# Patient Record
Sex: Female | Born: 1975 | Race: Black or African American | Hispanic: No | Marital: Single | Smoking: Never smoker
Health system: Southern US, Community
[De-identification: ages and names within clinical notes are randomized; demographics above are authoritative.]

## PROBLEM LIST (undated history)

## (undated) DIAGNOSIS — D649 Anemia, unspecified: Secondary | ICD-10-CM

## (undated) DIAGNOSIS — M797 Fibromyalgia: Secondary | ICD-10-CM

## (undated) DIAGNOSIS — E538 Deficiency of other specified B group vitamins: Secondary | ICD-10-CM

## (undated) HISTORY — DX: Fibromyalgia: M79.7

## (undated) HISTORY — DX: Anemia, unspecified: D64.9

---

## 1998-12-26 ENCOUNTER — Emergency Department (HOSPITAL_COMMUNITY): Admission: EM | Admit: 1998-12-26 | Discharge: 1998-12-26 | Payer: Self-pay | Admitting: *Deleted

## 1999-04-03 ENCOUNTER — Emergency Department (HOSPITAL_COMMUNITY): Admission: EM | Admit: 1999-04-03 | Discharge: 1999-04-03 | Payer: Self-pay | Admitting: Emergency Medicine

## 2000-10-20 ENCOUNTER — Other Ambulatory Visit: Admission: RE | Admit: 2000-10-20 | Discharge: 2000-10-20 | Payer: Self-pay | Admitting: Family Medicine

## 2000-11-17 ENCOUNTER — Other Ambulatory Visit: Admission: RE | Admit: 2000-11-17 | Discharge: 2000-11-17 | Payer: Self-pay | Admitting: Gynecology

## 2000-11-17 ENCOUNTER — Encounter (INDEPENDENT_AMBULATORY_CARE_PROVIDER_SITE_OTHER): Payer: Self-pay

## 2002-02-15 ENCOUNTER — Other Ambulatory Visit: Admission: RE | Admit: 2002-02-15 | Discharge: 2002-02-15 | Payer: Self-pay | Admitting: Gynecology

## 2003-07-31 ENCOUNTER — Emergency Department (HOSPITAL_COMMUNITY): Admission: EM | Admit: 2003-07-31 | Discharge: 2003-08-01 | Payer: Self-pay | Admitting: Emergency Medicine

## 2005-05-23 ENCOUNTER — Ambulatory Visit: Payer: Self-pay

## 2005-05-23 ENCOUNTER — Encounter: Payer: Self-pay | Admitting: Internal Medicine

## 2005-08-01 ENCOUNTER — Emergency Department (HOSPITAL_COMMUNITY): Admission: EM | Admit: 2005-08-01 | Discharge: 2005-08-01 | Payer: Self-pay | Admitting: Emergency Medicine

## 2005-08-07 ENCOUNTER — Encounter: Admission: RE | Admit: 2005-08-07 | Discharge: 2005-08-07 | Payer: Self-pay | Admitting: Otolaryngology

## 2007-08-20 ENCOUNTER — Encounter: Admission: RE | Admit: 2007-08-20 | Discharge: 2007-08-20 | Payer: Self-pay | Admitting: Internal Medicine

## 2008-07-02 ENCOUNTER — Encounter: Admission: RE | Admit: 2008-07-02 | Discharge: 2008-07-02 | Payer: Self-pay | Admitting: Obstetrics and Gynecology

## 2009-02-24 ENCOUNTER — Emergency Department (HOSPITAL_COMMUNITY): Admission: EM | Admit: 2009-02-24 | Discharge: 2009-02-24 | Payer: Self-pay | Admitting: Emergency Medicine

## 2009-02-25 ENCOUNTER — Encounter (INDEPENDENT_AMBULATORY_CARE_PROVIDER_SITE_OTHER): Payer: Self-pay | Admitting: Emergency Medicine

## 2009-02-25 ENCOUNTER — Ambulatory Visit: Payer: Self-pay | Admitting: Vascular Surgery

## 2009-02-25 ENCOUNTER — Ambulatory Visit (HOSPITAL_COMMUNITY): Admission: RE | Admit: 2009-02-25 | Discharge: 2009-02-25 | Payer: Self-pay | Admitting: Emergency Medicine

## 2010-01-15 ENCOUNTER — Encounter: Admission: RE | Admit: 2010-01-15 | Discharge: 2010-01-15 | Payer: Self-pay | Admitting: Internal Medicine

## 2010-08-14 IMAGING — US US EXTREM LOW VENOUS BILAT
1 series · 14 of 24 positions shown · non-contrast
Comparison: None.

CLINICAL DATA: Bilateral calf pain, right greater than left,
symptoms over the past week with progressive worsening.

BILATERAL LOWER EXTREMITY VENOUS DOPPLER ULTRASOUND 01/15/2010:
TECHNIQUE: Gray-scale sonography with compression, as well as
color and duplex Doppler ultrasound, were performed to evaluate the
deep venous system from the level of the common femoral vein
through the popliteal and proximal calf veins. Evaluation also
included physiologic response to augmentation.

[Series 1: us extrem low venous bilat · 14 of 48 slices shown]
[im 1/48]
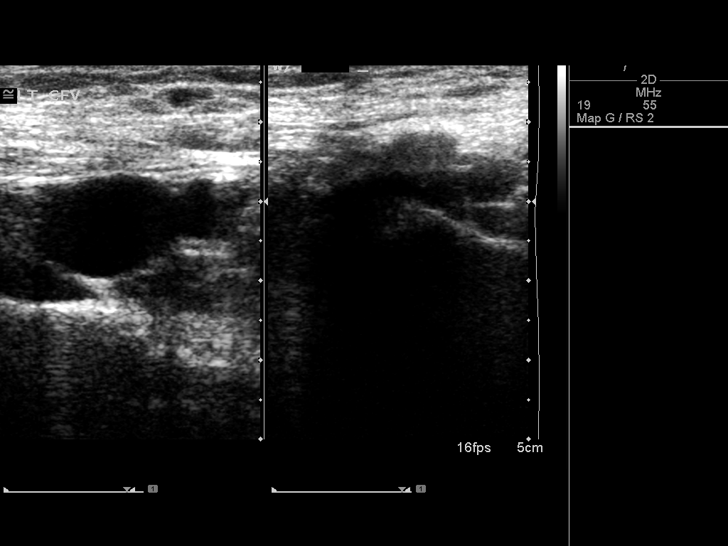
[im 5/48]
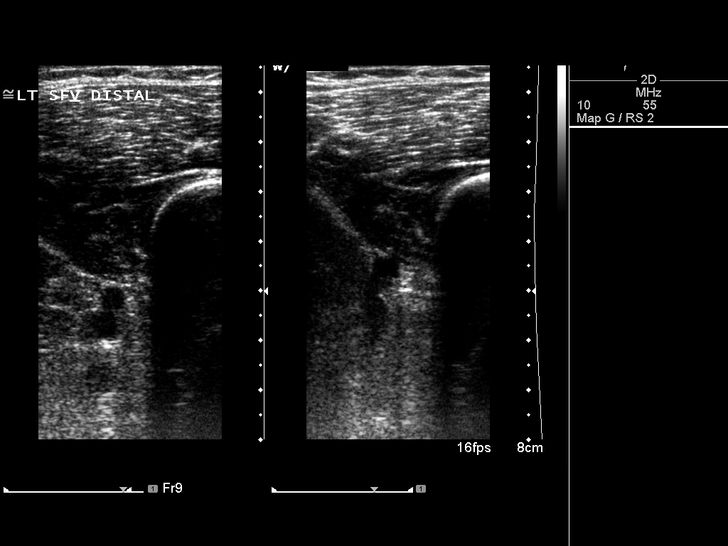
[im 9/48]
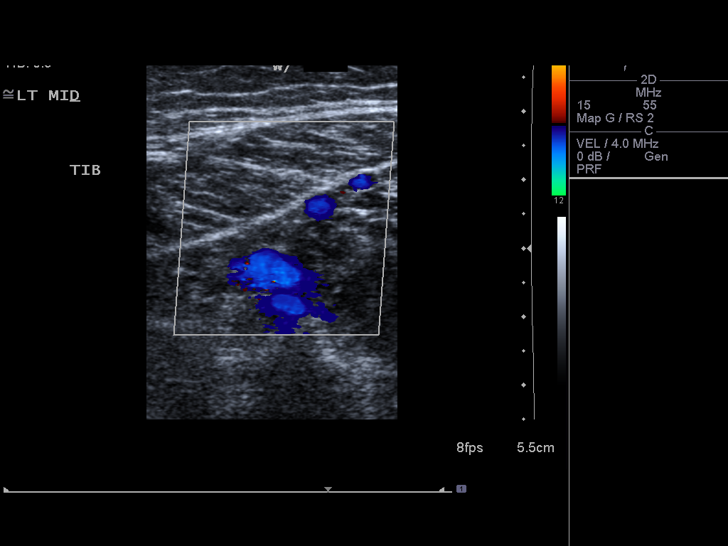
[im 13/48]
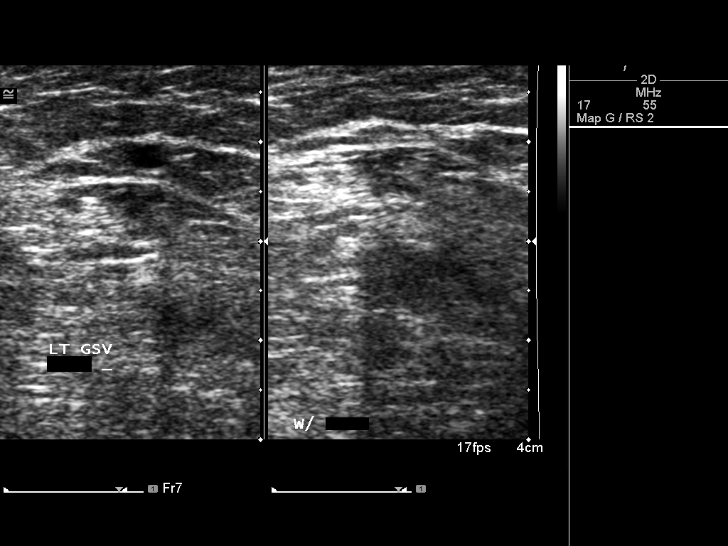
[im 15/48]
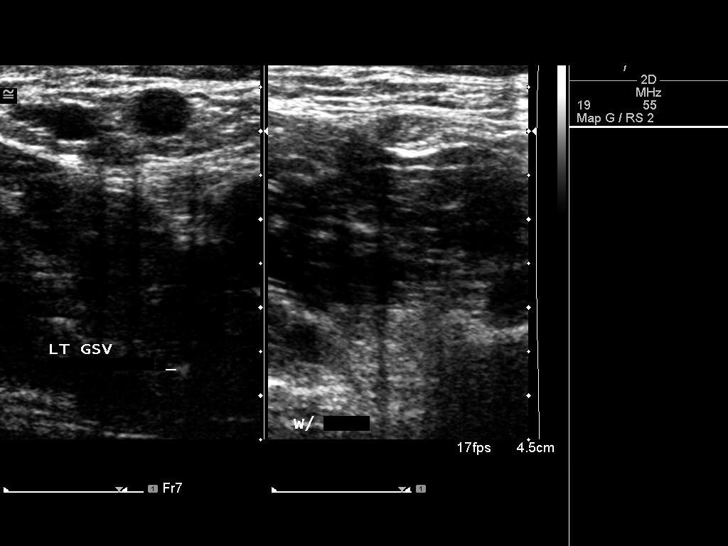
[im 19/48]
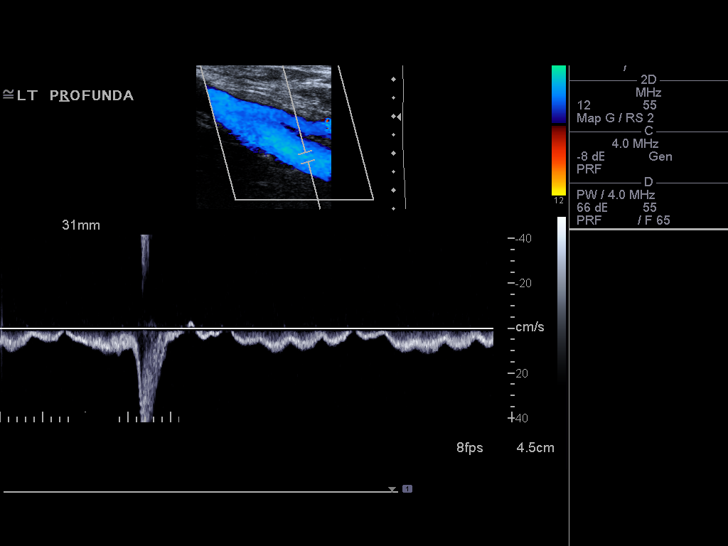
[im 23/48]
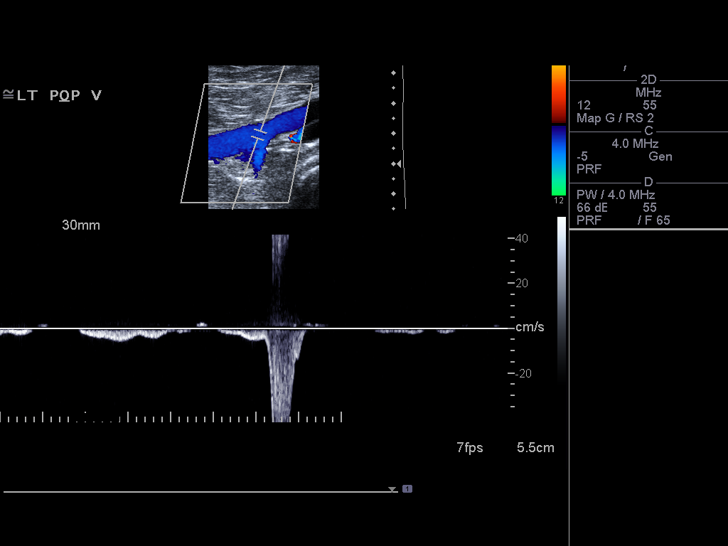
[im 25/48]
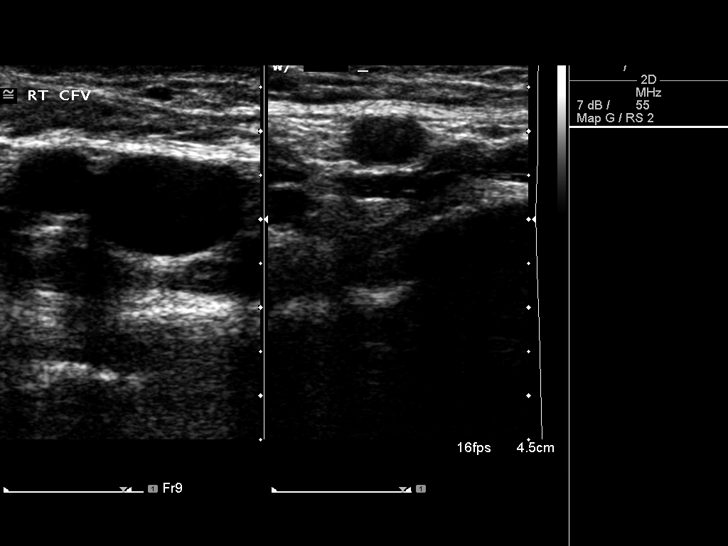
[im 29/48]
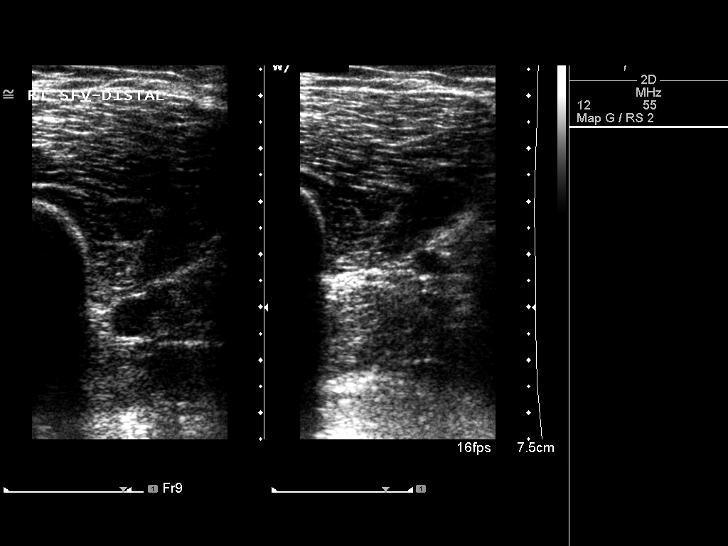
[im 33/48]
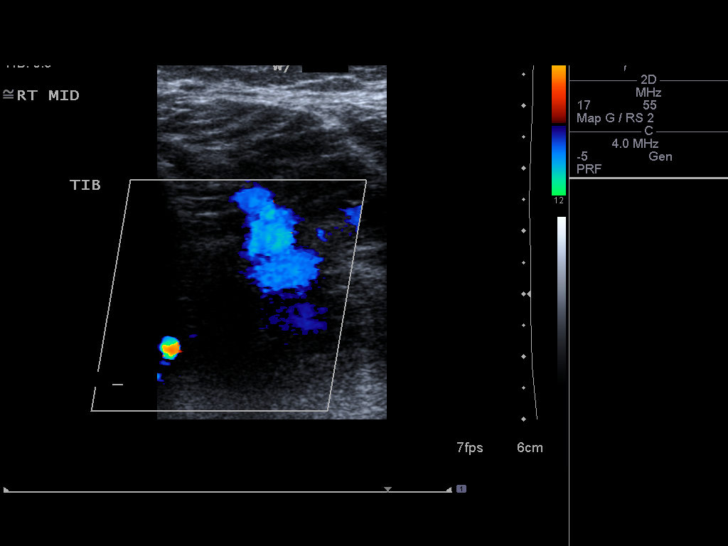
[im 37/48]
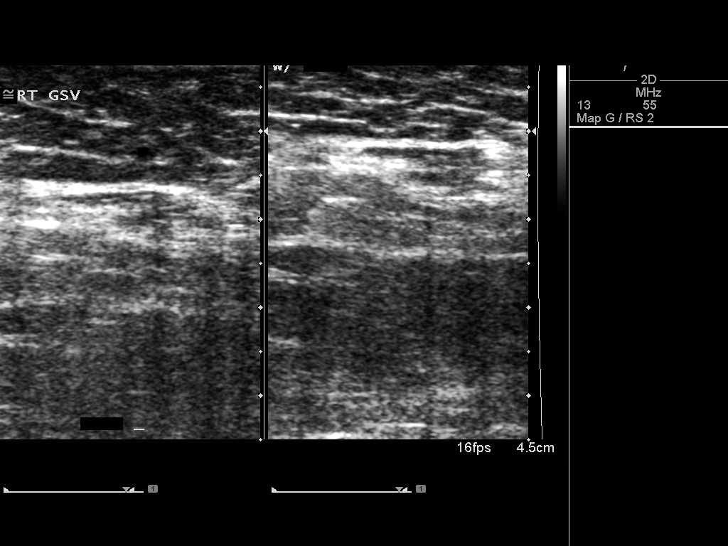
[im 39/48]
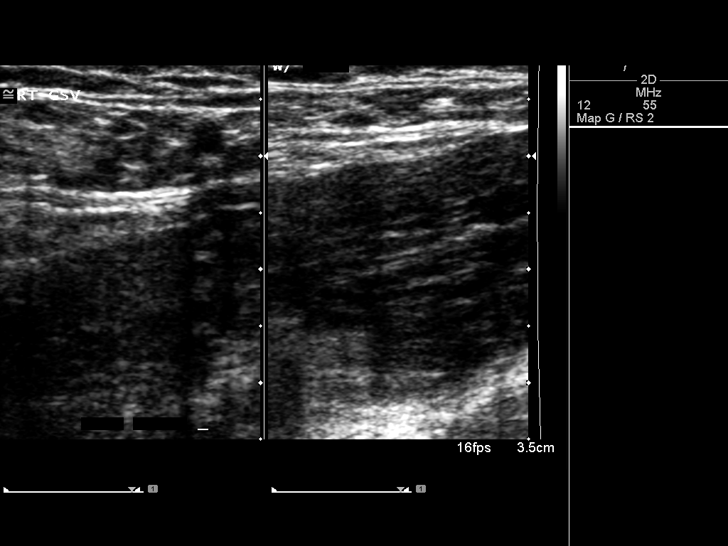
[im 43/48]
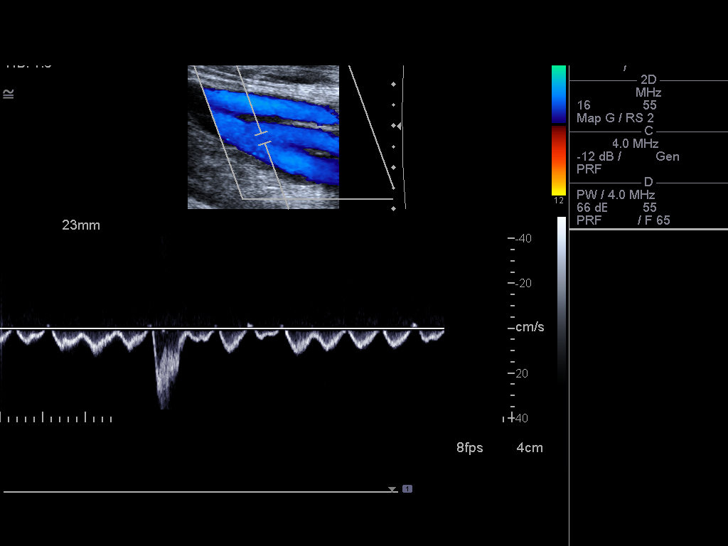
[im 48/48]
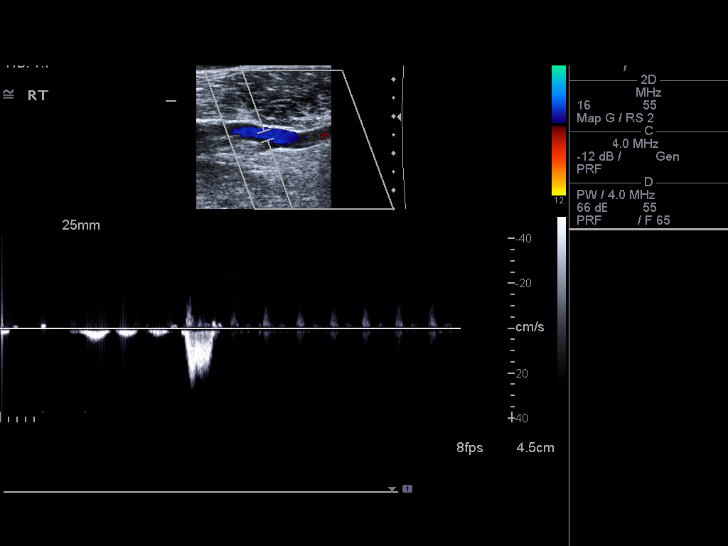

[14 of 24 positions shown; findings below may reference images not displayed]

FINDINGS: All of the visualized deep veins in both lower
extremities demonstrate normal Doppler signal, normal
compressibility, normal phasicity, and normal physiologic response
to augmentation.  No gray scale filling defects were identified.
The visualized greater saphenous veins are similarly patent in both
lower extremities.
IMPRESSION: No evidence of DVT or superficial thrombophlebitis involving either
the right or left lower extremity.

## 2010-08-23 ENCOUNTER — Emergency Department (HOSPITAL_COMMUNITY): Payer: BC Managed Care – PPO

## 2010-08-23 ENCOUNTER — Observation Stay (HOSPITAL_COMMUNITY)
Admission: EM | Admit: 2010-08-23 | Discharge: 2010-08-25 | Disposition: A | Discharge status: Home / Self Care | Payer: BC Managed Care – PPO | Attending: Infectious Diseases | Admitting: Infectious Diseases

## 2010-08-23 DIAGNOSIS — I891 Lymphangitis: Secondary | ICD-10-CM | POA: Insufficient documentation

## 2010-08-23 DIAGNOSIS — F3289 Other specified depressive episodes: Secondary | ICD-10-CM | POA: Insufficient documentation

## 2010-08-23 DIAGNOSIS — L02519 Cutaneous abscess of unspecified hand: Principal | ICD-10-CM | POA: Insufficient documentation

## 2010-08-23 DIAGNOSIS — R209 Unspecified disturbances of skin sensation: Secondary | ICD-10-CM

## 2010-08-23 DIAGNOSIS — F329 Major depressive disorder, single episode, unspecified: Secondary | ICD-10-CM | POA: Insufficient documentation

## 2010-08-23 DIAGNOSIS — L03019 Cellulitis of unspecified finger: Principal | ICD-10-CM | POA: Insufficient documentation

## 2010-08-23 LAB — LACTIC ACID, PLASMA: Lactic Acid, Venous: 1.4 mmol/L (ref 0.5–2.2)

## 2010-08-23 LAB — COMPREHENSIVE METABOLIC PANEL
AST: 17 U/L (ref 0–37)
Albumin: 4 g/dL (ref 3.5–5.2)
Alkaline Phosphatase: 50 U/L (ref 39–117)
GFR calc Af Amer: >60 mL/min (ref >60)
GFR calc non Af Amer: >60 mL/min (ref >60)
Glucose, Bld: 89 mg/dL (ref 70–99)
Sodium: 140 mEq/L (ref 135–145)
Total Bilirubin: 0.7 mg/dL (ref 0.3–1.2)
Total Protein: 7.3 g/dL (ref 6.0–8.3)

## 2010-08-23 LAB — URINE MICROSCOPIC-ADD ON

## 2010-08-23 LAB — RAPID URINE DRUG SCREEN, HOSP PERFORMED: Tetrahydrocannabinol: NOT DETECTED

## 2010-08-23 LAB — URINALYSIS, ROUTINE W REFLEX MICROSCOPIC
Ketones, ur: 15 mg/dL — AB
Leukocytes, UA: NEGATIVE
Specific Gravity, Urine: 1.036 — ABNORMAL HIGH (ref 1.005–1.030)
Urine Glucose, Fasting: NEGATIVE mg/dL

## 2010-08-23 LAB — CBC
MCH: 28.2 pg (ref 26.0–34.0)
RBC: 5.1 MIL/uL (ref 3.87–5.11)
RDW: 13.7 % (ref 11.5–15.5)
WBC: 4.1 10*3/uL (ref 4.0–10.5)

## 2010-08-23 LAB — POCT PREGNANCY, URINE: Preg Test, Ur: NEGATIVE

## 2010-08-23 LAB — DIFFERENTIAL
Basophils Relative: 1 % (ref 0–1)
Eosinophils Absolute: 0.2 10*3/uL (ref 0.0–0.7)
Eosinophils Relative: 6 % — ABNORMAL HIGH (ref 0–5)
Lymphocytes Relative: 23 % (ref 12–46)
Neutrophils Relative %: 61 % (ref 43–77)

## 2010-08-24 LAB — URINE CULTURE
Colony Count: NO GROWTH
Culture: NO GROWTH

## 2010-08-24 LAB — BASIC METABOLIC PANEL
BUN: 8 mg/dL (ref 6–23)
Calcium: 8.6 mg/dL (ref 8.4–10.5)
Creatinine, Ser: 0.77 mg/dL (ref 0.4–1.2)
Glucose, Bld: 90 mg/dL (ref 70–99)

## 2010-08-24 LAB — CBC
HCT: 37.3 % (ref 36.0–46.0)
MCH: 27.2 pg (ref 26.0–34.0)
MCV: 81.1 fL (ref 78.0–100.0)
Platelets: 170 10*3/uL (ref 150–400)
RBC: 4.6 MIL/uL (ref 3.87–5.11)

## 2010-08-25 DIAGNOSIS — M79609 Pain in unspecified limb: Secondary | ICD-10-CM

## 2010-08-29 LAB — CULTURE, BLOOD (ROUTINE X 2)

## 2010-08-30 LAB — CULTURE, BLOOD (ROUTINE X 2)
Culture  Setup Time: 201202140116
Culture: NO GROWTH

## 2010-09-15 ENCOUNTER — Emergency Department (HOSPITAL_BASED_OUTPATIENT_CLINIC_OR_DEPARTMENT_OTHER)
Admission: EM | Admit: 2010-09-15 | Discharge: 2010-09-15 | Disposition: A | Discharge status: Home / Self Care | Payer: BC Managed Care – PPO | Attending: Emergency Medicine | Admitting: Emergency Medicine

## 2010-09-15 ENCOUNTER — Emergency Department (INDEPENDENT_AMBULATORY_CARE_PROVIDER_SITE_OTHER): Payer: BC Managed Care – PPO

## 2010-09-15 DIAGNOSIS — R079 Chest pain, unspecified: Secondary | ICD-10-CM

## 2010-09-15 LAB — CBC
MCH: 27.9 pg (ref 26.0–34.0)
MCV: 78 fL (ref 78.0–100.0)
Platelets: 145 10*3/uL — ABNORMAL LOW (ref 150–400)
RDW: 14.2 % (ref 11.5–15.5)
WBC: 4.1 10*3/uL (ref 4.0–10.5)

## 2010-09-15 LAB — DIFFERENTIAL
Basophils Absolute: 0 10*3/uL (ref 0.0–0.1)
Basophils Relative: 1 % (ref 0–1)
Eosinophils Absolute: 0.2 10*3/uL (ref 0.0–0.7)
Lymphocytes Relative: 30 % (ref 12–46)
Monocytes Absolute: 0.4 10*3/uL (ref 0.1–1.0)
Neutro Abs: 2.3 10*3/uL (ref 1.7–7.7)
Neutrophils Relative %: 56 % (ref 43–77)

## 2010-09-15 LAB — BASIC METABOLIC PANEL
BUN: 12 mg/dL (ref 6–23)
Calcium: 8.8 mg/dL (ref 8.4–10.5)
Chloride: 105 mEq/L (ref 96–112)
Glucose, Bld: 85 mg/dL (ref 70–99)
Potassium: 3.7 mEq/L (ref 3.5–5.1)

## 2010-09-15 LAB — POCT CARDIAC MARKERS: Troponin i, poc: <0.05 ng/mL (ref 0.00–0.09)

## 2010-10-12 NOTE — Discharge Summary (Signed)
Samantha Velazquez, Samantha Velazquez             ACCOUNT NO.:  [id number]  MEDICAL RECORD NO.:  [id number]           PATIENT TYPE:  I  LOCATION:  5006                         FACILITY:  [facility]  PHYSICIAN:  Lacretia Leigh. Estefany Goebel, M.D.DATE OF BIRTH:  1975/08/18  DATE OF ADMISSION:  08/23/2010 DATE OF DISCHARGE:  08/25/2010                              DISCHARGE SUMMARY   DISCHARGE DIAGNOSES: 1. Cellulitis. 2. Depression.  DISCHARGE MEDICATIONS: 1. Augmentin 875 mg p.o. b.i.d. for 12 days. 2. Ultram 50 mg q.6 h. as needed for pain for 10 days.  DISPOSITION AND FOLLOWUP:  [name] was discharged on August 25, 2010, in stable and improved condition.  Upon followup on August 26, 2010, with Dr. Dorothyann Peng, she will need the following things checked. 1. Continued recession of finger erythema and swelling secondary to     her cellulitis.  She will be on Augmentin, and it will be important     to assess her further clinical improvement. 2. For her depression, to continue to assess her for response to     antidepressant therapy and assess the need for medication change.     This followup will occur on August 26, 2010, with Dr. Dorothyann Peng.  PROCEDURES PERFORMED: 1. On August 23, 2010, chest x-ray, 2-view, showed no acute     cardiopulmonary disease. 2. On August 23, 2010, a hand x-ray of her left hand showed no acute     findings specifically no bony erosions to suggest osteomyelitis. 3. On August 25, 2010, a lower extremity Doppler, please follow up     what is in dictation.  CONSULTATIONS:  None.  ADMITTING HISTORY AND PHYSICAL:  [name] is a 35 year old woman with a past medical history of depression presenting with redness, pain, and decreased range of motion of her left index finger.  The pain has been associated with itchiness.  The pain is constant and not exacerbated by movement.  Of note, the patient has 2 cats at home.  She does not recall a bite or scratch.   She does not have fevers or chills associated with these symptoms.  She went to Urgent Care yesterday and was told that she was having an allergic reaction and to take Claritin.  She did not improve and thus came into the ED.  In ED, she received a hand x-ray showing no acute findings or bony erosions.  Had a normal chest x-ray. For cellulitis, she was admitted.  PHYSICAL EXAMINATION:  VITAL SIGNS:  Temperature 99.1, blood pressure 138/77, pulse is 112, respiratory rate 18-20, O2 sats are 100% on room air. GENERAL:  In no acute distress. EYES:  Pupils are equal, round, and reactive to light.  Extraocular movements intact.  Anicteric. ENT:  Moist mucous membranes. NECK:  Supple without thyromegaly. RESPIRATIONS:  Clear to auscultation bilaterally without wheezes. CARDIOVASCULAR:  S1 and S2.  Regular rate and rhythm without murmurs, rubs, or gallops. GI:  Positive bowel sounds.  Soft, nontender, and nondistended without organomegaly. EXTREMITIES:  Cool.  Without edema.  Red streaks from index finger to wrist.  Decreased range of motion of her  left index finger. SKIN:  Without rashes or areas of skin breakdown. LYMPHATICS:  Without axial lymphadenopathy or generalized lymphadenopathy. MUSCULOSKELETAL:  Without joint pain apart from left finger.  Reports soreness in calves. NEUROLOGIC:  Cranial nerves II-XII grossly intact.  Flat affect.  ADMISSION LABORATORIES:  Sodium 140, potassium 3.7, chloride 106, bicarb 26, BUN 10, creatinine 0.78, glucose 85.  White blood cells 4.1, hemoglobin 14.4, platelets 183, absolute neutrophil count 2.5, mean corpuscular volume 81.2.  Bilirubin of 0.7, alk phos of 50, AST of 17, ALT of 11, protein 7.3, albumin 4.0, calcium 9.0.  Urinalysis showed few epithelial cells, 0-2 white blood cells, 3-6 red blood cells, rare bacteria with a specific gravity of 1.036, small hemoglobin, small bilirubin, 15 ketones, negative nitrites, negative leukocyte  esterase.  HOSPITAL COURSE BY PROBLEM: 1. Cellulitis.  On admission, her cellulitis showed red streaks up to     the wrist.  She was started on Unasyn IV for this and treated with     pain medications.  Over the course of 2 days, the redness decreased     and the range of motion of her left index finger improved with a     hand x-ray showing no acute findings.  Unasyn was     chosen for empiric coverage due to exposure to cats.  At     the time of discharge, she is afebrile without leukocytosis.  She     will be discharged with a 12-day course for a total of 14 days with     Augmentin 875 mg b.i.d. p.o. 2. Depression.  Of note on this admission, Ms. Favela states that she     has been out of work for a few days secondary to anxiety and     depression with a doctor's note.  This should be followed up as an     outpatient.  As the primary care physician will best know how to     proceed with this problem.  DISCHARGE LABORATORIES AND VITAL SIGNS:  Vitals:  Temperature 98.0, pulse 80, respirations 18, systolic blood pressure 108, diastolic blood pressure 70, O2 sats 100% on room air.  Discharge labs:  Blood culture x2 drawn on August 23, 2010, show no growth to date.  Urine culture, no growth.  Sodium 139, potassium 3.6, chloride 108, bicarb 27, BUN 8, creatinine 0.77, glucose 90.  White blood cells 4.8, hemoglobin 12.5, hematocrit 37.3, platelets 170, MCV 81.1.    ______________________________ Carrolyn Meiers, MD   ______________________________ Lacretia Leigh. Ninetta Lights, M.D.   MH/MEDQ  D:  08/25/2010  T:  08/26/2010  Job:  [id number]  cc:   Candyce Churn. Allyne Gee, M.D.  Electronically Signed by Carrolyn Meiers MD on 09/16/2010 03:06:19 PM Electronically Signed by Johny Sax M.D. on 10/12/2010 04:09:38 PM

## 2010-10-16 LAB — URINALYSIS, ROUTINE W REFLEX MICROSCOPIC
Ketones, ur: NEGATIVE mg/dL
Leukocytes, UA: NEGATIVE
Nitrite: NEGATIVE
Protein, ur: 30 mg/dL — AB
Urobilinogen, UA: 1 mg/dL (ref 0.0–1.0)

## 2010-10-16 LAB — COMPREHENSIVE METABOLIC PANEL
ALT: 15 U/L (ref 0–35)
AST: 17 U/L (ref 0–37)
Albumin: 4.3 g/dL (ref 3.5–5.2)
Alkaline Phosphatase: 41 U/L (ref 39–117)
BUN: 13 mg/dL (ref 6–23)
GFR calc Af Amer: >60 mL/min (ref >60)
Potassium: 4.1 mEq/L (ref 3.5–5.1)
Sodium: 137 mEq/L (ref 135–145)
Total Protein: 8 g/dL (ref 6.0–8.3)

## 2010-10-16 LAB — PROTIME-INR: INR: 1.1 (ref 0.00–1.49)

## 2010-10-16 LAB — PREGNANCY, URINE: Preg Test, Ur: NEGATIVE

## 2010-10-16 LAB — URINE MICROSCOPIC-ADD ON

## 2010-12-28 ENCOUNTER — Emergency Department (HOSPITAL_BASED_OUTPATIENT_CLINIC_OR_DEPARTMENT_OTHER)
Admission: EM | Admit: 2010-12-28 | Discharge: 2010-12-28 | Disposition: A | Discharge status: Home / Self Care | Payer: BC Managed Care – PPO | Attending: Emergency Medicine | Admitting: Emergency Medicine

## 2010-12-28 DIAGNOSIS — H9209 Otalgia, unspecified ear: Secondary | ICD-10-CM | POA: Insufficient documentation

## 2010-12-28 DIAGNOSIS — H7290 Unspecified perforation of tympanic membrane, unspecified ear: Secondary | ICD-10-CM | POA: Insufficient documentation

## 2011-04-14 IMAGING — CR DG CHEST 2V
2 series · 2 of 2 positions shown · non-contrast
Comparison: 08/23/2010

CLINICAL DATA: Chest pain

CHEST - 2 VIEW

[w chest pa]
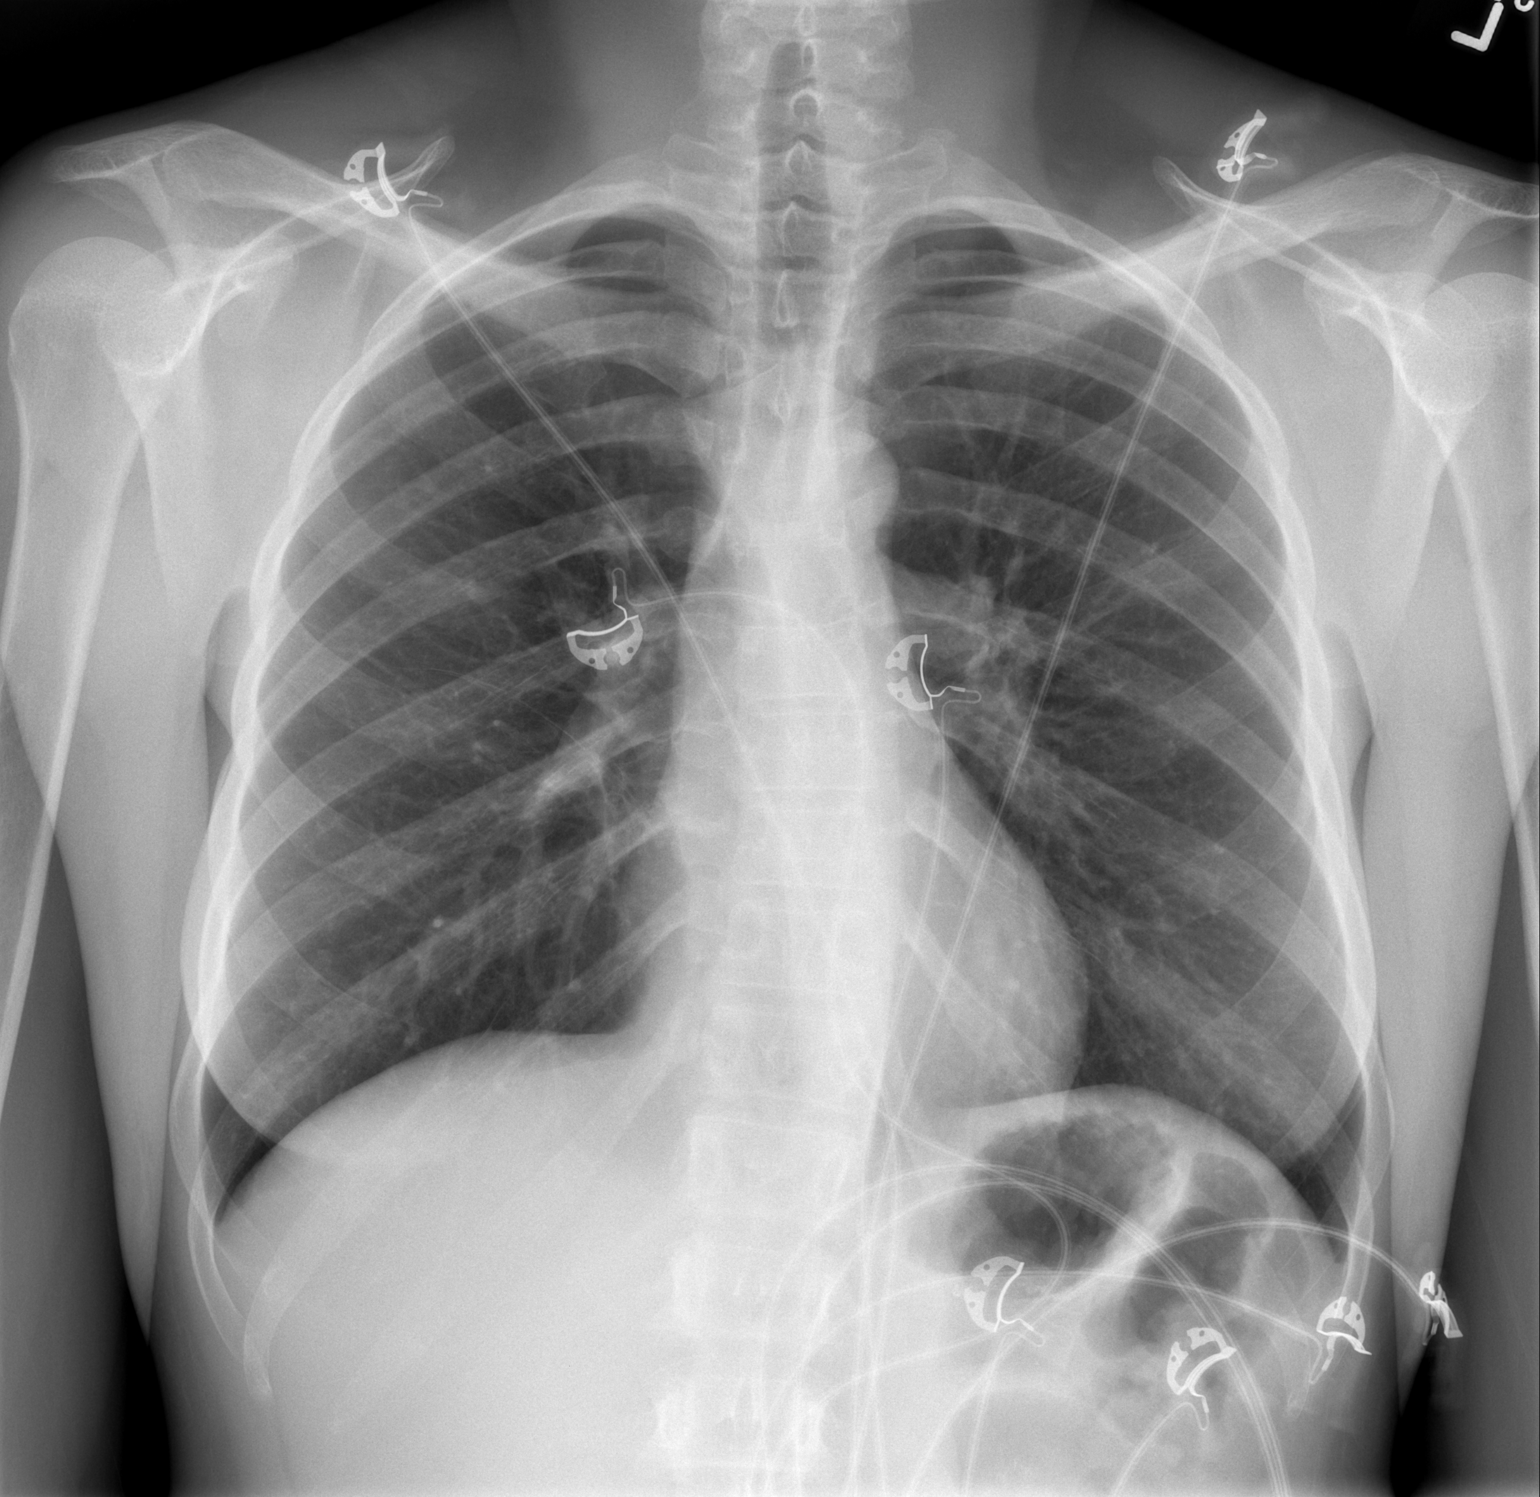

[w chest lat]
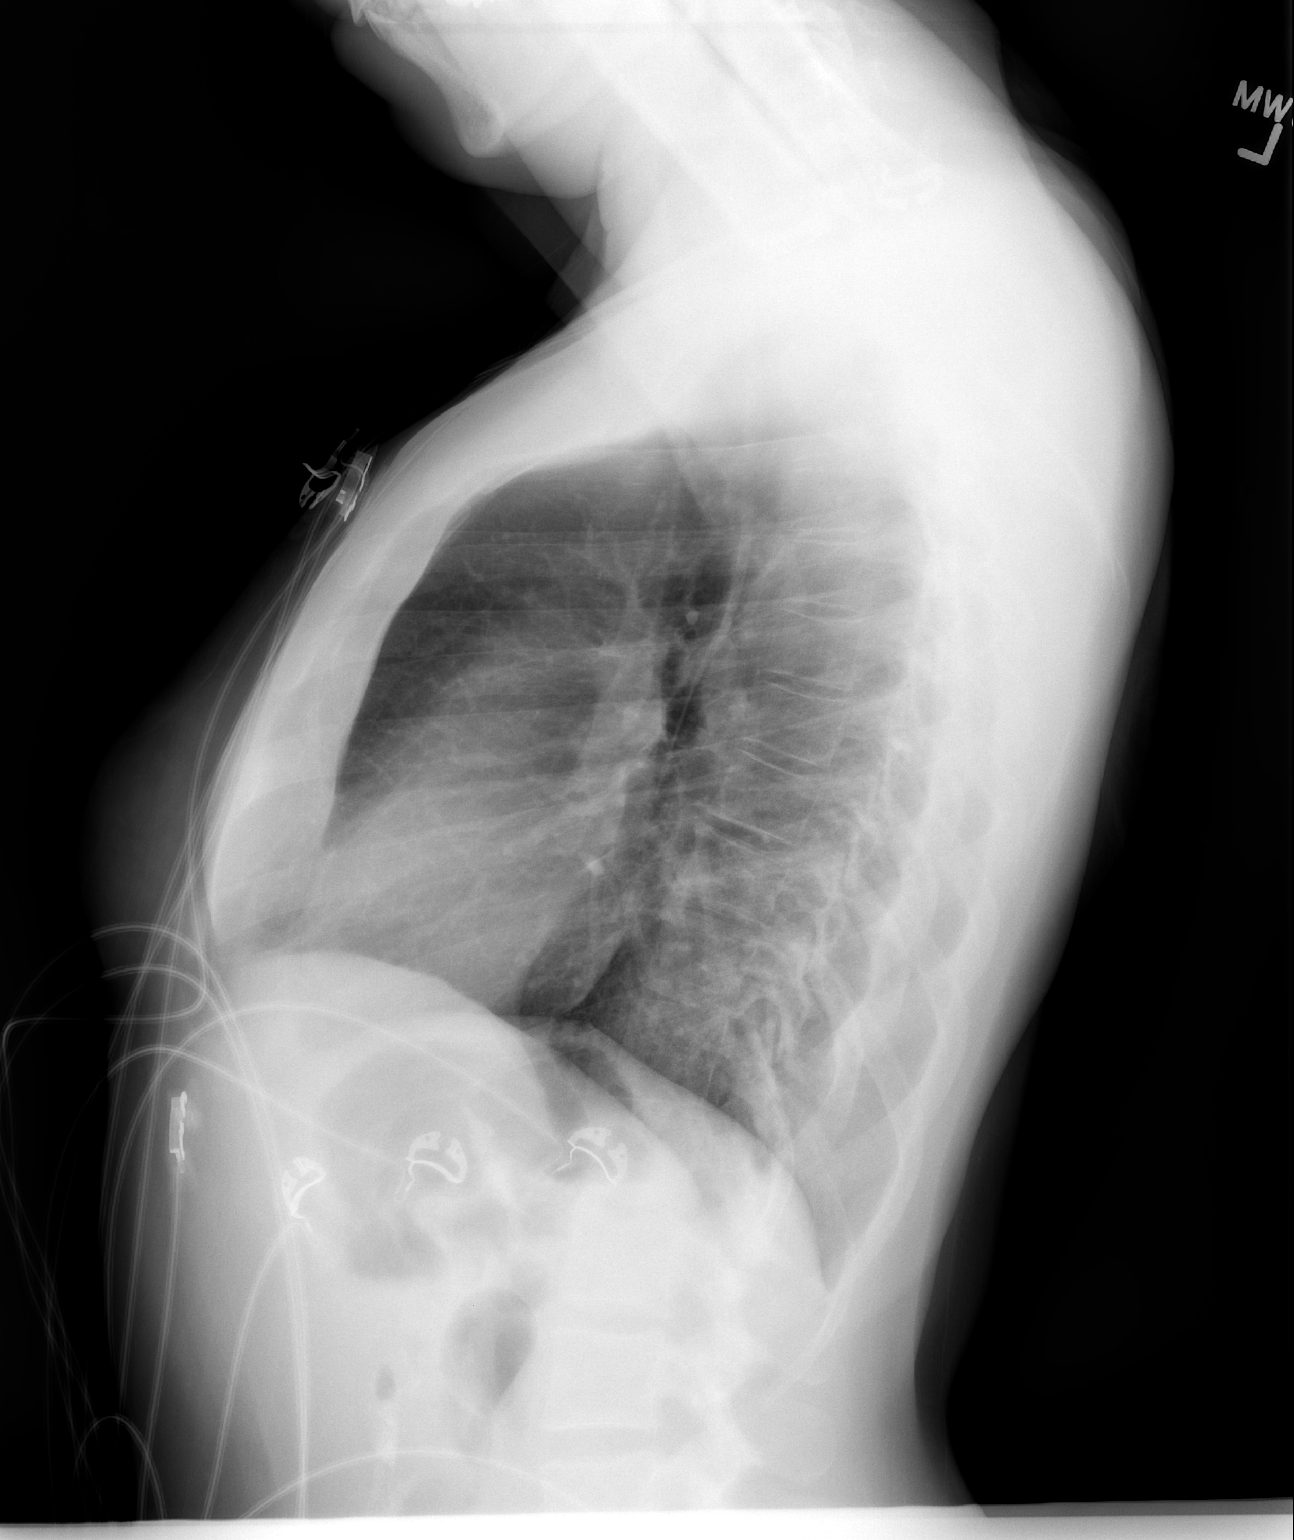

[2 of 2 positions shown; findings below may reference images not displayed]

FINDINGS: Heart and mediastinal contours normal.  Lungs clear.  No
pleural fluid.  Osseous structures and soft tissues unremarkable.

Again noted is mild biconcave thoracolumbar scoliosis.
IMPRESSION: No active disease.

## 2011-05-23 ENCOUNTER — Emergency Department (HOSPITAL_COMMUNITY)
Admission: EM | Admit: 2011-05-23 | Discharge: 2011-05-23 | Disposition: A | Discharge status: Home / Self Care | Payer: BC Managed Care – PPO | Attending: Emergency Medicine | Admitting: Emergency Medicine

## 2011-05-23 DIAGNOSIS — L293 Anogenital pruritus, unspecified: Secondary | ICD-10-CM | POA: Insufficient documentation

## 2011-05-23 DIAGNOSIS — M25569 Pain in unspecified knee: Secondary | ICD-10-CM | POA: Insufficient documentation

## 2011-05-23 DIAGNOSIS — M2550 Pain in unspecified joint: Secondary | ICD-10-CM

## 2011-05-23 DIAGNOSIS — M79609 Pain in unspecified limb: Secondary | ICD-10-CM | POA: Insufficient documentation

## 2011-05-23 DIAGNOSIS — L299 Pruritus, unspecified: Secondary | ICD-10-CM

## 2011-05-23 DIAGNOSIS — L298 Other pruritus: Secondary | ICD-10-CM | POA: Insufficient documentation

## 2011-05-23 DIAGNOSIS — R21 Rash and other nonspecific skin eruption: Secondary | ICD-10-CM | POA: Insufficient documentation

## 2011-05-23 DIAGNOSIS — L2989 Other pruritus: Secondary | ICD-10-CM | POA: Insufficient documentation

## 2011-05-23 HISTORY — DX: Deficiency of other specified B group vitamins: E53.8

## 2011-05-23 MED ORDER — HYDROXYZINE HCL 25 MG PO TABS: 25.0000 mg | ORAL_TABLET | Freq: Four times a day (QID) | ORAL | Status: AC

## 2011-05-23 NOTE — ED Provider Notes (Signed)
History     CSN: [id number] Arrival date & time: 05/23/2011  8:25 AM   First MD Initiated Contact with Patient 05/23/11 0845      Chief Complaint  Patient presents with  . Pruritis    (Consider location/radiation/quality/duration/timing/severity/associated sxs/prior treatment) HPI Comments: Patient has several weeks to months of intermittent complaints of redness in both hands and upper arms.  It is associated with pruritus.  She describes that there is intermittent rash although there is no rash at this time.  She also has very strong pains particularly in her hands and arms in her knees.  Her knees are not causing her pain today.  She's been using ibuprofen to help with her pain which helps to some degree.  She's also been using psoriasin  cream on her hands.  She denies any fevers, nausea, vomiting, facial rash.  Patient is a 35 y.o. female presenting with vaginal itching. The history is provided by the patient. No language interpreter was used.  Vaginal Itching This is a recurrent problem. The current episode started more than 1 week ago. The problem occurs daily. The problem has not changed since onset.Pertinent negatives include no chest pain, no abdominal pain, no headaches and no shortness of breath. The symptoms are aggravated by nothing. The symptoms are relieved by medications.    Past Medical History  Diagnosis Date  . Vitamin B 12 deficiency   . Vitamin D deficiency     History reviewed. No pertinent past surgical history.  Family History  Problem Relation Age of Onset  . Hypertension Mother   . Cancer Father   . Diabetes Other   . Hypertension Other     History  Substance Use Topics  . Smoking status: Never Smoker   . Smokeless tobacco: Not on file  . Alcohol Use: Yes    OB History    Grav Para Term Preterm Abortions TAB SAB Ect Mult Living                  Review of Systems  Constitutional: Negative.  Negative for fever and chills.  HENT: Negative.    Eyes: Negative.  Negative for discharge and redness.  Respiratory: Negative.  Negative for cough and shortness of breath.   Cardiovascular: Negative.  Negative for chest pain.  Gastrointestinal: Negative.  Negative for nausea, vomiting, abdominal pain and diarrhea.  Genitourinary: Negative.  Negative for dysuria and vaginal discharge.  Musculoskeletal: Positive for arthralgias. Negative for back pain.  Skin: Positive for color change and rash.  Neurological: Negative.  Negative for syncope and headaches.  Hematological: Negative.  Negative for adenopathy.  Psychiatric/Behavioral: Negative.  Negative for confusion.  All other systems reviewed and are negative.    Allergies  Vicodin  Home Medications   Current Outpatient Rx  Name Route Sig Dispense Refill  . AMOXICILLIN-POT CLAVULANATE 875-125 MG PO TABS Oral Take 1 tablet by mouth every 12 (twelve) hours.      Marland Kitchen VITAMIN D 1000 UNITS PO TABS Oral Take 2,000 Units by mouth daily.      Marland Kitchen PSORIASIN EX Apply externally Apply 1 application topically 2 (two) times daily as needed. For psoriasis     . PREDNISONE 20 MG PO TABS Oral Take 20 mg by mouth daily. Take 20mg  daily for 5 days       BP 131/85  Pulse 108  Temp(Src) 97.9 F (36.6 C) (Oral)  Resp 16  SpO2 100%  LMP 05/09/2011  Physical Exam  Constitutional: She is  oriented to person, place, and time. She appears well-developed and well-nourished.  HENT:  Head: Normocephalic and atraumatic.  Eyes: Conjunctivae and EOM are normal. Pupils are equal, round, and reactive to light.  Neck: Normal range of motion. Neck supple.  Pulmonary/Chest: Effort normal.  Musculoskeletal: Normal range of motion.  Neurological: She is alert and oriented to person, place, and time.  Skin: Skin is warm and dry. No rash noted. No erythema. No pallor.  Psychiatric: She has a normal mood and affect. Her behavior is normal. Judgment and thought content normal.    ED Course  Procedures (including  critical care time)  Labs Reviewed - No data to display No results found.   No diagnosis found.    MDM  Patient presents with complaints of intermittent itching primarily in her hands and upper arms.  She has this intermittent redness and joint pains as well.  Her Dr. has obtained followup with the rheumatologist for her on November 28.  Patient is no acute signs of infection.  No acute signs of septic arthritis.  No acute signs of hives for allergic reaction at this time.  I've advised the patient to use over-the-counter Benadryl I will prescribe her Atarax for her itching.  I've also reassured her that the rheumatologist is the appropriate followup for her combined issues.        Nat Christen, MD 05/23/11 [phone]

## 2011-05-23 NOTE — ED Notes (Signed)
Patient presents with mild redness and itching to bilateral hands x several weeks with joint pain.  Patient seen by PCP and has an appointment with a rheumatologist on 06-08-11.

## 2012-02-20 ENCOUNTER — Encounter: Payer: Self-pay | Admitting: Obstetrics and Gynecology

## 2012-02-20 ENCOUNTER — Ambulatory Visit (INDEPENDENT_AMBULATORY_CARE_PROVIDER_SITE_OTHER): Payer: BC Managed Care – PPO | Admitting: Obstetrics and Gynecology

## 2012-02-20 VITALS — BP 110/70 | Ht 63.0 in | Wt 151.0 lb

## 2012-02-20 DIAGNOSIS — N898 Other specified noninflammatory disorders of vagina: Secondary | ICD-10-CM

## 2012-02-20 DIAGNOSIS — R102 Pelvic and perineal pain: Secondary | ICD-10-CM

## 2012-02-20 DIAGNOSIS — N949 Unspecified condition associated with female genital organs and menstrual cycle: Secondary | ICD-10-CM

## 2012-02-20 LAB — POCT WET PREP (WET MOUNT)

## 2012-02-20 NOTE — Progress Notes (Signed)
ANNUAL GYNECOLOGIC EXAMINATION   Samantha Velazquez is a 36 y.o. female, G0P0, who presents for an annual exam. See above. The patient has not been seen in office for almost 3 years.  She has a history of pelvic pain.  She has recently been treated with antibiotics.  She complains of vaginal burning and discharge.  She has been treated with Diflucan.  She is currently taking metronidazole.  She complains of vague pelvic pain that has been worse over the last 2 months.  Her pain occurs twice each month.  It is related 2 out of 10. She has some stomach disturbance.  Prior Hysterectomy: No    History   Social History  . Marital Status: Single    Spouse Name: N/A    Number of Children: N/A  . Years of Education: N/A   Social History Main Topics  . Smoking status: Never Smoker   . Smokeless tobacco: None  . Alcohol Use: Yes  . Drug Use: No  . Sexually Active: Yes    Birth Control/ Protection: None   Other Topics Concern  . None   Social History Narrative  . None    Menstrual cycle:   LMP: Patient's last menstrual period was 02/03/2012.             The following portions of the patient's history were reviewed and updated as appropriate: allergies, current medications, past family history, past medical history, past social history, past surgical history and problem list.  Review of Systems Pertinent items are noted in HPI. Breast:Negative for breast lump,nipple discharge or nipple retraction Gastrointestinal: Negative for abdominal pain, change in bowel habits or rectal bleeding Urinary:negative   Objective:    BP 110/70  Ht 5\' 3"  (1.6 m)  Wt 151 lb (68.493 kg)  BMI 26.75 kg/m2  LMP 02/03/2012    Weight:  Wt Readings from Last 1 Encounters:  02/20/12 151 lb (68.493 kg)          BMI: Body mass index is 26.75 kg/(m^2).  General Appearance: Alert, appropriate appearance for age. No acute distress HEENT: Grossly normal Neck / Thyroid: Supple, no masses, nodes or  enlargement Lungs: clear to auscultation bilaterally Back: No CVA tenderness Breast Exam: No masses or nodes.No dimpling, nipple retraction or discharge. Cardiovascular: Regular rate and rhythm. S1, S2, no murmur Gastrointestinal: Soft, non-tender, no masses or organomegaly  ++++++++++++++++++++++++++++++++++++++++++++++++++++++++  Pelvic Exam: External genitalia: normal general appearance Vaginal: normal without tenderness, induration or masses and relaxation: No Cervix: normal appearance Adnexa: normal bimanual exam Uterus: normal size, shape, and consistency Rectovaginal: normal rectal, no masses  ++++++++++++++++++++++++++++++++++++++++++++++++++++++++  Lymphatic Exam: Non-palpable nodes in neck, clavicular, axillary, or inguinal regions Neurologic: Normal speech, no tremor  Psychiatric: Alert and oriented, appropriate affect.   Wet Prep:   PH 4.5, no yeast seen, whiff negative Urinalysis:  not applicable UPT:           Not done   Assessment:    Normal gyn exam   Overweight or obese: No   Pelvic relaxation: No  Vaginal burning and discomfort.  Pelvic pain   Plan:   Return for ultrasound Contraception:vasectomy    Medications prescribe:  Mycolog II cream  STD screen request: Yes ; gonorrhea and Chlamydia  The updated Pap smear screening guidelines were discussed with the patient. The patient requested that I obtain a Pap smear: No.  Kegel exercises discussed: Yes.  Proper diet and regular exercise were reviewed.  Annual mammograms recommended starting at age 65. Proper  breast care was discussed.  Screening colonoscopy is recommended beginning at age 3.  Regular health maintenance was reviewed.  Sleep hygiene was discussed.  Adequate calcium and vitamin D intake was emphasized.  Leonard Schwartz, M.D. 02/20/2012

## 2012-02-21 ENCOUNTER — Encounter: Payer: Self-pay | Admitting: Obstetrics and Gynecology

## 2012-02-21 ENCOUNTER — Other Ambulatory Visit: Payer: Self-pay | Admitting: Emergency Medicine

## 2012-02-21 ENCOUNTER — Ambulatory Visit (INDEPENDENT_AMBULATORY_CARE_PROVIDER_SITE_OTHER): Payer: BC Managed Care – PPO

## 2012-02-21 ENCOUNTER — Ambulatory Visit (INDEPENDENT_AMBULATORY_CARE_PROVIDER_SITE_OTHER): Payer: BC Managed Care – PPO | Admitting: Obstetrics and Gynecology

## 2012-02-21 VITALS — BP 112/72 | Wt 151.0 lb

## 2012-02-21 DIAGNOSIS — N949 Unspecified condition associated with female genital organs and menstrual cycle: Secondary | ICD-10-CM

## 2012-02-21 DIAGNOSIS — R109 Unspecified abdominal pain: Secondary | ICD-10-CM

## 2012-02-21 DIAGNOSIS — R102 Pelvic and perineal pain: Secondary | ICD-10-CM

## 2012-02-21 LAB — GC/CHLAMYDIA PROBE AMP, GENITAL: Chlamydia, DNA Probe: NEGATIVE

## 2012-02-21 MED ORDER — NYSTATIN-TRIAMCINOLONE 100000-0.1 UNIT/GM-% EX CREA: TOPICAL_CREAM | Freq: Four times a day (QID) | CUTANEOUS | Status: DC

## 2012-02-21 NOTE — Patient Instructions (Signed)
Diagnostic Laparoscopy Laparoscopy is a surgical procedure. It is used to diagnose and treat diseases inside the belly(abdomen). It is usually a brief, common, and relatively simple procedure. The laparoscopeis a thin, lighted, pencil-sized instrument. It is like a telescope. It is inserted into your abdomen through a small cut (incision). Your caregiver can look at the organs inside your body through this instrument. He or she can see if there is anything abnormal. Laparoscopy can be done either in a hospital or outpatient clinic. You may be given a mild sedative to help you relax before the procedure. Once in the operating room, you will be given a drug to make you sleep (general anesthesia). Laparoscopy usually lasts less than 1 hour. After the procedure, you will be monitored in a recovery area until you are stable and doing well. Once you are home, it will take 2 to 3 days to fully recover. RISKS AND COMPLICATIONS  Laparoscopy has relatively few risks. Your caregiver will discuss the risks with you before the procedure. Some problems that can occur include:  Infection.   Bleeding.   Damage to other organs.   Anesthetic side effects.  PROCEDURE Once you receive anesthesia, your surgeon inflates the abdomen with a harmless gas (carbon dioxide). This makes the organs easier to see. The laparoscope is inserted into the abdomen through a small incision. This allows your surgeon to see into the abdomen. Other small instruments are also inserted into the abdomen through other small openings. Many surgeons attach a video camera to the laparoscope to enlarge the view. During a diagnostic laparoscopy, the surgeon may be looking for inflammation, infection, or cancer. Your surgeon may take tissue samples(biopsies). The samples are sent to a specialist in looking at cells and tissue samples (pathologist). The pathologist examines them under a microscope. Biopsies can help to diagnose or confirm a  disease. AFTER THE PROCEDURE   The gas is released from inside the abdomen.   The incisions are closed with stitches (sutures). Because these incisions are small (usually less than 1/2 inch), there is usually minimal discomfort after the procedure. There may be some mild discomfort in the throat. This is from the tube placed in the throat while you were sleeping. You may have some mild abdominal discomfort. There may also be discomfort from the instrument placement incisions in the abdomen.   The recovery time is shortened as long as there are no complications.   You will rest in a recovery room until stable and doing well. As long as there are no complications, you may be allowed to go home.  FINDING OUT THE RESULTS OF YOUR TEST Not all test results are available during your visit. If your test results are not back during the visit, make an appointment with your caregiver to find out the results. Do not assume everything is normal if you have not heard from your caregiver or the medical facility. It is important for you to follow up on all of your test results. HOME CARE INSTRUCTIONS   Take all medicines as directed.   Only take over-the-counter or prescription medicines for pain, discomfort, or fever as directed by your caregiver.   Resume daily activities as directed.   Showers are preferred over baths.   You may resume sexual activities in 1 week or as directed.   Do not drive while taking narcotics.  SEEK MEDICAL CARE IF:   There is increasing abdominal pain.   There is new pain in the shoulders (shoulder strap areas).     You feel lightheaded or faint.   You have the chills.   You or your child has an oral temperature above 102 F (38.9 C).   There is pus-like (purulent) drainage from any of the wounds.   You are unable to pass gas or have a bowel movement.   You feel sick to your stomach (nauseous) or throw up (vomit).  MAKE SURE YOU:   Understand these instructions.    Will watch your condition.   Will get help right away if you are not doing well or get worse.  Document Released: 10/03/2000 Document Revised: 06/16/2011 Document Reviewed: 06/27/2007 [employer] Patient Information 2012 [employer]. 

## 2012-02-21 NOTE — Addendum Note (Signed)
Addended by: Janine Limbo on: 02/21/2012 08:30 AM   Modules accepted: Orders

## 2012-02-21 NOTE — Progress Notes (Signed)
HISTORY OF PRESENT ILLNESS  [name] is a 36 y.o. year old female,G0P0, who presents for a problem visit. Gonorrhea and Chlamydia were negative from yesterday.  Subjective:  Continues to complain of vague mid abdominal pain.  Objective:  BP 112/72  Wt 151 lb (68.493 kg)  LMP 02/03/2012   General: no distress GI: soft and nontender, no masses  Exam deferred.  Ultrasound: Normal uterus and ovaries.  No masses appreciated.  Thickened endometrium.  Assessment:  Abdominal pain of uncertain etiology.  Normal ultrasound. Probable constipation.  Plan:  Management options reviewed.  The patient elects to observe her only for now.  Information given about laparoscopy.  Return to office in 1 year(s).   Leonard Schwartz M.D.  02/21/2012 2:14 PM

## 2012-02-22 ENCOUNTER — Other Ambulatory Visit: Payer: Self-pay | Admitting: Obstetrics and Gynecology

## 2012-02-22 DIAGNOSIS — R102 Pelvic and perineal pain: Secondary | ICD-10-CM

## 2012-08-20 ENCOUNTER — Encounter: Payer: Self-pay | Admitting: Obstetrics and Gynecology

## 2012-08-20 ENCOUNTER — Ambulatory Visit: Payer: BC Managed Care – PPO | Admitting: Obstetrics and Gynecology

## 2012-08-20 ENCOUNTER — Telehealth: Payer: Self-pay | Admitting: Obstetrics and Gynecology

## 2012-08-20 VITALS — BP 110/70 | Temp 98.8°F | Wt 145.0 lb

## 2012-08-20 DIAGNOSIS — N6320 Unspecified lump in the left breast, unspecified quadrant: Secondary | ICD-10-CM

## 2012-08-20 NOTE — Progress Notes (Signed)
37 YO complains of left breast lump with a family history of 2 second degree relatives with breast cancer (5th & 6 decades).   Felt the painful lump on yesterday that is also sore to touch. Denies nipple discharge, trauma or change in skin of breast.   O: Breasts: right with fibrocystic changes and no dominant nodules, tenderness, nipple discharge,  skin changes or adenopathy; left with area of thickening in the 12 o'clock-2 o'clock position  with tenderness but no adenopathy,skin changes or nipple discharge  A: Left Breast Mass     Second Degree Relatives Breast Cancer  P: Dx MG with possible left breast ultrasound at the breast center     Mentioned option for general surgery referral-will await assessment from MG to decide     RTO-as scheduled or prn  Yona Kosek, PA-C

## 2012-08-22 ENCOUNTER — Ambulatory Visit
Admission: RE | Admit: 2012-08-22 | Discharge: 2012-08-22 | Disposition: A | Discharge status: Home / Self Care | Payer: BC Managed Care – PPO | Source: Ambulatory Visit | Attending: Obstetrics and Gynecology | Admitting: Obstetrics and Gynecology

## 2012-08-22 DIAGNOSIS — N6320 Unspecified lump in the left breast, unspecified quadrant: Secondary | ICD-10-CM

## 2012-08-30 ENCOUNTER — Other Ambulatory Visit: Payer: Self-pay

## 2012-10-04 ENCOUNTER — Other Ambulatory Visit: Payer: Self-pay | Admitting: Obstetrics and Gynecology

## 2012-10-05 ENCOUNTER — Other Ambulatory Visit: Payer: Self-pay | Admitting: Family Medicine

## 2012-10-05 DIAGNOSIS — N63 Unspecified lump in unspecified breast: Secondary | ICD-10-CM

## 2012-10-10 ENCOUNTER — Ambulatory Visit
Admission: RE | Admit: 2012-10-10 | Discharge: 2012-10-10 | Disposition: A | Discharge status: Home / Self Care | Payer: BC Managed Care – PPO | Source: Ambulatory Visit | Attending: Family Medicine | Admitting: Family Medicine

## 2012-10-10 DIAGNOSIS — N63 Unspecified lump in unspecified breast: Secondary | ICD-10-CM

## 2012-11-01 ENCOUNTER — Telehealth: Payer: Self-pay | Admitting: Oncology

## 2012-11-01 NOTE — Telephone Encounter (Signed)
S/W PT IN RE NP APPT 05/22 @ 10:30 W/DR. SHADAD REFERRING DR. SHAILI DEVESHWAR DX- LOW WBC WELCOME PACKET MAILED.

## 2012-11-06 ENCOUNTER — Telehealth: Payer: Self-pay | Admitting: Oncology

## 2012-11-06 NOTE — Telephone Encounter (Signed)
C/D 11/06/12 for appt. 11/29/12

## 2012-11-23 ENCOUNTER — Other Ambulatory Visit: Payer: Self-pay | Admitting: Oncology

## 2012-11-23 DIAGNOSIS — D649 Anemia, unspecified: Secondary | ICD-10-CM

## 2012-11-29 ENCOUNTER — Other Ambulatory Visit (HOSPITAL_BASED_OUTPATIENT_CLINIC_OR_DEPARTMENT_OTHER): Payer: BC Managed Care – PPO | Admitting: Lab

## 2012-11-29 ENCOUNTER — Telehealth: Payer: Self-pay | Admitting: Oncology

## 2012-11-29 ENCOUNTER — Ambulatory Visit: Payer: BC Managed Care – PPO

## 2012-11-29 ENCOUNTER — Encounter: Payer: Self-pay | Admitting: Oncology

## 2012-11-29 ENCOUNTER — Ambulatory Visit (HOSPITAL_BASED_OUTPATIENT_CLINIC_OR_DEPARTMENT_OTHER): Payer: BC Managed Care – PPO | Admitting: Oncology

## 2012-11-29 VITALS — BP 121/76 | HR 90 | Temp 97.1°F | Wt 142.4 lb

## 2012-11-29 DIAGNOSIS — D649 Anemia, unspecified: Secondary | ICD-10-CM

## 2012-11-29 DIAGNOSIS — D72819 Decreased white blood cell count, unspecified: Secondary | ICD-10-CM

## 2012-11-29 DIAGNOSIS — D509 Iron deficiency anemia, unspecified: Secondary | ICD-10-CM

## 2012-11-29 LAB — CBC WITH DIFFERENTIAL/PLATELET
Basophils Absolute: 0.1 10*3/uL (ref 0.0–0.1)
Eosinophils Absolute: 0.1 10*3/uL (ref 0.0–0.5)
HGB: 11.6 g/dL (ref 11.6–15.9)
MCV: 77 fL — ABNORMAL LOW (ref 79.5–101.0)
MONO%: 10.3 % (ref 0.0–14.0)
NEUT#: 1.8 10*3/uL (ref 1.5–6.5)
RDW: 25 % — ABNORMAL HIGH (ref 11.2–14.5)
lymph#: 1.3 10*3/uL (ref 0.9–3.3)

## 2012-11-29 LAB — FERRITIN: Ferritin: 32 ng/mL (ref 10–291)

## 2012-11-29 LAB — IRON AND TIBC
Iron: 63 ug/dL (ref 42–145)
UIBC: 237 ug/dL (ref 125–400)

## 2012-11-29 LAB — COMPREHENSIVE METABOLIC PANEL (CC13)
Albumin: 3.5 g/dL (ref 3.5–5.0)
BUN: 18.6 mg/dL (ref 7.0–26.0)
Calcium: 9.1 mg/dL (ref 8.4–10.4)
Chloride: 107 mEq/L (ref 98–107)
Glucose: 92 mg/dl (ref 70–99)
Potassium: 4.5 mEq/L (ref 3.5–5.1)

## 2012-11-29 NOTE — Progress Notes (Signed)
Reason for Referral: Leukopenia and anemia.   HPI: Samantha Velazquez is a pleasant 37 year old woman currently of [place] whom I am evaluating for leukocytopenia and anemia. She is a pleasant and healthy woman with a past medical history significant for fibromyalgia, vitamin D deficiency and B12 deficiency. She follows up with her primary care physician as well as a rheumatology. On 10/09/2012 she had a CBC done and showed that she has anemia and a hemoglobin of 9.0 and an MCV of 68.8. She was also noted there to have  leukopenia with a white cell count of 2.9 but a normal differential. She was started on supplements including iron supplements, zinc supplements as well as vitamin D. She have that helped to some fatigue and some tiredness at times but have corrected and slightly improved since starting her vitamin supplements.  She have not any problem with leukopenia in the past however she have had iron deficiency in the past and did require IV iron. She does reports menorrhagia times however most recently her menstrual cycles have been rather regular and not heavy.  She is not reporting any bleeding problems including epistaxis, hematochezia, melena, genitourinary bleed or easy bruisability. She had not had any opportunistic infections she does report arthralgias and myalgias presumably related to her fibromyalgia.  Overall she continues to perform activities of daily living without any hindrance or decline. She continued to drive, work full time and to run a personal business.   Past Medical History  Diagnosis Date  . Vitamin B 12 deficiency   . Vitamin D deficiency   . Fibromyalgia   :  Current Outpatient Prescriptions  Medication Sig Dispense Refill  . Ascorbic Acid (VITAMIN C) 1000 MG tablet Take 1,000 mg by mouth daily.      . cholecalciferol (VITAMIN D) 1000 UNITS tablet Take 50,000 Units by mouth 2 (two) times a week. Tuesday and friday      . Cyanocobalamin (VITAMIN B-12 IJ) Inject as  directed.      . Iron-FA-B Cmp-C-Biot-Probiotic (FUSION PLUS PO) Take 1 tablet by mouth daily.      . metroNIDAZOLE (FLAGYL) 500 MG tablet Take 500 mg by mouth 3 (three) times daily. As needed      . phentermine 37.5 MG capsule Take 37.5 mg by mouth every morning.      . Pseudoephedrine-Guaifenesin (AMITEX PSE PO) Take 1 tablet by mouth daily.      Marland Kitchen zinc gluconate 50 MG tablet Take 50 mg by mouth daily.       No current facility-administered medications for this visit.     Allergies  Allergen Reactions  . Vicodin (Hydrocodone-Acetaminophen) Anxiety and Palpitations  :  Family History  Problem Relation Age of Onset  . Hypertension Mother   . Cancer Father   . Diabetes Other   . Hypertension Other   :  History   Social History  . Marital Status: Single    Spouse Name: N/A    Number of Children: N/A  . Years of Education: N/A   Occupational History  . Not on file.   Social History Main Topics  . Smoking status: Never Smoker   . Smokeless tobacco: Never Used  . Alcohol Use: Yes  . Drug Use: No  . Sexually Active: Yes    Birth Control/ Protection: Abstinence, None   Other Topics Concern  . Not on file   Social History Narrative  . No narrative on file  :  A comprehensive review of systems was negative.  Exam: ECOG 0 Blood pressure 121/76, pulse 90, temperature 97.1 F (36.2 C), temperature source Oral, weight 142 lb 7 oz (64.609 kg). General appearance: alert Head: Normocephalic, without obvious abnormality, atraumatic Nose: Nares normal. Septum midline. Mucosa normal. No drainage or sinus tenderness. Throat: lips, mucosa, and tongue normal; teeth and gums normal Neck: no adenopathy, no carotid bruit, no JVD, supple, symmetrical, trachea midline and thyroid not enlarged, symmetric, no tenderness/mass/nodules Resp: clear to auscultation bilaterally Chest wall: no tenderness Cardio: regular rate and rhythm, S1, S2 normal, no murmur, click, rub or gallop GI:  soft, non-tender; bowel sounds normal; no masses,  no organomegaly Extremities: extremities normal, atraumatic, no cyanosis or edema Skin: Skin color, texture, turgor normal. No rashes or lesions Lymph nodes: Cervical, supraclavicular, and axillary nodes normal.   Recent Labs  11/29/12 1052  WBC 3.6*  HGB 11.6  HCT 36.6  PLT 238    Recent Labs  11/29/12 1052  NA 141  K 4.5  CL 107  CO2 27  GLUCOSE 92  BUN 18.6  CREATININE 0.8  CALCIUM 9.1     Blood smear review: I. personally reviewed her peripheral smear today and showed no abnormal white cells. There were no blasts or any dysplasia. The red cells appeared microcytic and hypochromic without any red cell fragmentation. Platelet counts appear to be slightly increased in the size is slightly enlarged.  Assessment and Plan:   37 year old woman with the following issues:  1. Leukopenia: The differential diagnosis discussed today with Ms. Ueda. It appears that her white cell count has improved today with a total white cell count of 3.6 with the lower limit of normal 3.9. Possibilities include reactive leukocytopenia versus ethnic variation versus leukopenia due to iron deficiency. Usually he will not see iron levels impact total white cell count but definitely correlated in her case. Her white cell count improved with the correction of her iron. Myelodysplastic syndrome, myeloproliferative disorders or leukemia are extremely a likely at this point. Her peripheral smear including the differential was perfectly normal and does not suggest a serious bone disorder. I do not recommend further workup or evaluation at this time however I do recommend continuous followup. For that reason, I will have her come back in 4 months time and repeat a CBC and a peripheral smear with a personal review to make sure that continues to be the case.   2. Iron deficiency anemia: She is currently on oral supplements in her hemoglobin have corrected at  this time. Her MCV is still low but is improving dramatically with oral iron. I discussed with her IV iron supplementation if needed to the form of Feraheme. She have received IV iron in the past and for the time being per first continue with the oral supplements and I certainly agree with that. I will repeat her iron stores in 4 months to insure that she is not developing any further iron deficiency anemia.  All her questions were answered today.

## 2012-11-29 NOTE — Progress Notes (Signed)
Checked in new patient with no financial issues.  °

## 2012-12-21 ENCOUNTER — Telehealth: Payer: Self-pay | Admitting: *Deleted

## 2012-12-21 NOTE — Telephone Encounter (Signed)
sw pt gv appt d/t for 04/02/13 for labs@1pm  and ov@1 :30pm.the patient is aware that i will also mail a letter/avs as well...td

## 2013-03-29 ENCOUNTER — Telehealth: Payer: Self-pay | Admitting: *Deleted

## 2013-03-29 NOTE — Telephone Encounter (Signed)
Lm informed the pt that her appts for 04/02/13 was rs to 04/01/13. gv appts for 04/01/13 w/labs@ 9:45am and ov@ 10:15am....td

## 2013-04-01 ENCOUNTER — Ambulatory Visit: Payer: BC Managed Care – PPO | Admitting: Nurse Practitioner

## 2013-04-01 ENCOUNTER — Other Ambulatory Visit: Payer: BC Managed Care – PPO

## 2013-04-02 ENCOUNTER — Other Ambulatory Visit: Payer: BC Managed Care – PPO | Admitting: Lab

## 2013-04-02 ENCOUNTER — Ambulatory Visit: Payer: BC Managed Care – PPO | Admitting: Oncology

## 2013-04-02 ENCOUNTER — Telehealth: Payer: Self-pay | Admitting: Oncology

## 2013-04-02 NOTE — Telephone Encounter (Signed)
Gave pt appt for Novemner with labs , r/s from September per pt rqst

## 2013-05-16 ENCOUNTER — Other Ambulatory Visit: Payer: Self-pay

## 2013-05-22 ENCOUNTER — Ambulatory Visit: Payer: BC Managed Care – PPO | Admitting: Oncology

## 2013-05-22 ENCOUNTER — Other Ambulatory Visit: Payer: BC Managed Care – PPO | Admitting: Lab

## 2013-07-15 ENCOUNTER — Encounter (HOSPITAL_COMMUNITY): Payer: Self-pay | Admitting: Emergency Medicine

## 2013-07-15 ENCOUNTER — Emergency Department (HOSPITAL_COMMUNITY): Payer: Self-pay

## 2013-07-15 DIAGNOSIS — J3489 Other specified disorders of nose and nasal sinuses: Secondary | ICD-10-CM | POA: Insufficient documentation

## 2013-07-15 DIAGNOSIS — Z79899 Other long term (current) drug therapy: Secondary | ICD-10-CM | POA: Insufficient documentation

## 2013-07-15 DIAGNOSIS — IMO0001 Reserved for inherently not codable concepts without codable children: Secondary | ICD-10-CM | POA: Insufficient documentation

## 2013-07-15 DIAGNOSIS — R05 Cough: Secondary | ICD-10-CM | POA: Insufficient documentation

## 2013-07-15 DIAGNOSIS — B9689 Other specified bacterial agents as the cause of diseases classified elsewhere: Secondary | ICD-10-CM | POA: Insufficient documentation

## 2013-07-15 DIAGNOSIS — R0602 Shortness of breath: Secondary | ICD-10-CM | POA: Insufficient documentation

## 2013-07-15 DIAGNOSIS — E559 Vitamin D deficiency, unspecified: Secondary | ICD-10-CM | POA: Insufficient documentation

## 2013-07-15 DIAGNOSIS — E538 Deficiency of other specified B group vitamins: Secondary | ICD-10-CM | POA: Insufficient documentation

## 2013-07-15 DIAGNOSIS — N760 Acute vaginitis: Secondary | ICD-10-CM | POA: Insufficient documentation

## 2013-07-15 DIAGNOSIS — Z3202 Encounter for pregnancy test, result negative: Secondary | ICD-10-CM | POA: Insufficient documentation

## 2013-07-15 DIAGNOSIS — R059 Cough, unspecified: Secondary | ICD-10-CM | POA: Insufficient documentation

## 2013-07-15 DIAGNOSIS — Z885 Allergy status to narcotic agent status: Secondary | ICD-10-CM | POA: Insufficient documentation

## 2013-07-15 DIAGNOSIS — R071 Chest pain on breathing: Secondary | ICD-10-CM | POA: Insufficient documentation

## 2013-07-15 DIAGNOSIS — A499 Bacterial infection, unspecified: Secondary | ICD-10-CM | POA: Insufficient documentation

## 2013-07-15 LAB — BASIC METABOLIC PANEL
BUN: 20 mg/dL (ref 6–23)
CHLORIDE: 102 meq/L (ref 96–112)
CO2: 25 mEq/L (ref 19–32)
Calcium: 9.1 mg/dL (ref 8.4–10.5)
Creatinine, Ser: 0.7 mg/dL (ref 0.50–1.10)
GFR calc Af Amer: >90 mL/min (ref >90)
GLUCOSE: 87 mg/dL (ref 70–99)
POTASSIUM: 4.3 meq/L (ref 3.7–5.3)
SODIUM: 138 meq/L (ref 137–147)

## 2013-07-15 LAB — CBC
HEMATOCRIT: 37.2 % (ref 36.0–46.0)
Hemoglobin: 12.8 g/dL (ref 12.0–15.0)
MCH: 27.5 pg (ref 26.0–34.0)
MCHC: 34.4 g/dL (ref 30.0–36.0)
MCV: 80 fL (ref 78.0–100.0)
Platelets: 191 10*3/uL (ref 150–400)
RBC: 4.65 MIL/uL (ref 3.87–5.11)
RDW: 16.9 % — ABNORMAL HIGH (ref 11.5–15.5)
WBC: 6.7 10*3/uL (ref 4.0–10.5)

## 2013-07-15 LAB — POCT I-STAT TROPONIN I: TROPONIN I, POC: 0 ng/mL (ref 0.00–0.08)

## 2013-07-15 LAB — POCT PREGNANCY, URINE: Preg Test, Ur: NEGATIVE

## 2013-07-15 NOTE — ED Notes (Signed)
Pt reports chest tightness/heaviness that began on Saturday w/ some shortness of breath and nausea - pain is constant, worse today - pt admits to non-productive cough, has taken tylenol cold and mucinex w/ minimal relief. Pt denies any fever.

## 2013-07-16 ENCOUNTER — Emergency Department (HOSPITAL_COMMUNITY)
Admission: EM | Admit: 2013-07-16 | Discharge: 2013-07-16 | Disposition: A | Discharge status: Home / Self Care | Payer: BC Managed Care – PPO | Attending: Emergency Medicine | Admitting: Emergency Medicine

## 2013-07-16 DIAGNOSIS — N760 Acute vaginitis: Secondary | ICD-10-CM

## 2013-07-16 DIAGNOSIS — B9689 Other specified bacterial agents as the cause of diseases classified elsewhere: Secondary | ICD-10-CM

## 2013-07-16 DIAGNOSIS — R0789 Other chest pain: Secondary | ICD-10-CM

## 2013-07-16 LAB — URINE MICROSCOPIC-ADD ON

## 2013-07-16 LAB — URINALYSIS, ROUTINE W REFLEX MICROSCOPIC
Bilirubin Urine: NEGATIVE
Glucose, UA: NEGATIVE mg/dL
Hgb urine dipstick: NEGATIVE
KETONES UR: 15 mg/dL — AB
NITRITE: NEGATIVE
Protein, ur: NEGATIVE mg/dL
SPECIFIC GRAVITY, URINE: 1.035 — AB (ref 1.005–1.030)
UROBILINOGEN UA: 1 mg/dL (ref 0.0–1.0)
pH: 5.5 (ref 5.0–8.0)

## 2013-07-16 LAB — WET PREP, GENITAL
Trich, Wet Prep: NONE SEEN
Yeast Wet Prep HPF POC: NONE SEEN

## 2013-07-16 MED ORDER — KETOROLAC TROMETHAMINE 30 MG/ML IJ SOLN
30.0000 mg | Freq: Once | INTRAMUSCULAR | Status: AC
  Administered 2013-07-16: 30 mg via INTRAMUSCULAR

## 2013-07-16 MED ORDER — IBUPROFEN 600 MG PO TABS: 600.0000 mg | ORAL_TABLET | Freq: Four times a day (QID) | ORAL | Status: DC | PRN

## 2013-07-16 MED ORDER — METRONIDAZOLE 500 MG PO TABS: 500.0000 mg | ORAL_TABLET | Freq: Two times a day (BID) | ORAL | Status: DC

## 2013-07-16 NOTE — ED Notes (Signed)
Pelvic cart at BS.  

## 2013-07-16 NOTE — Discharge Instructions (Signed)
Bacterial Vaginosis Bacterial vaginosis (BV) is a vaginal infection where the normal balance of bacteria in the vagina is disrupted. The normal balance is then replaced by an overgrowth of certain bacteria. There are several different kinds of bacteria that can cause BV. BV is the most common vaginal infection in women of childbearing age. CAUSES   The cause of BV is not fully understood. BV develops when there is an increase or imbalance of harmful bacteria.  Some activities or behaviors can upset the normal balance of bacteria in the vagina and put women at increased risk including:  Having a new sex partner or multiple sex partners.  Douching.  Using an intrauterine device (IUD) for contraception.  It is not clear what role sexual activity plays in the development of BV. However, women that have never had sexual intercourse are rarely infected with BV. Women do not get BV from toilet seats, bedding, swimming pools or from touching objects around them.  SYMPTOMS   Grey vaginal discharge.  A fish-like odor with discharge, especially after sexual intercourse.  Itching or burning of the vagina and vulva.  Burning or pain with urination.  Some women have no signs or symptoms at all. DIAGNOSIS  Your caregiver must examine the vagina for signs of BV. Your caregiver will perform lab tests and look at the sample of vaginal fluid through a microscope. They will look for bacteria and abnormal cells (clue cells), a pH test higher than 4.5, and a positive amine test all associated with BV.  RISKS AND COMPLICATIONS   Pelvic inflammatory disease (PID).  Infections following gynecology surgery.  Developing HIV.  Developing herpes virus. TREATMENT  Sometimes BV will clear up without treatment. However, all women with symptoms of BV should be treated to avoid complications, especially if gynecology surgery is planned. Female partners generally do not need to be treated. However, BV may spread  between female sex partners so treatment is helpful in preventing a recurrence of BV.   BV may be treated with antibiotics. The antibiotics come in either pill or vaginal cream forms. Either can be used with nonpregnant or pregnant women, but the recommended dosages differ. These antibiotics are not harmful to the baby.  BV can recur after treatment. If this happens, a second round of antibiotics will often be prescribed.  Treatment is important for pregnant women. If not treated, BV can cause a premature delivery, especially for a pregnant woman who had a premature birth in the past. All pregnant women who have symptoms of BV should be checked and treated.  For chronic reoccurrence of BV, treatment with a type of prescribed gel vaginally twice a week is helpful. HOME CARE INSTRUCTIONS   Finish all medication as directed by your caregiver.  Do not have sex until treatment is completed.  Tell your sexual partner that you have a vaginal infection. They should see their caregiver and be treated if they have problems, such as a mild rash or itching.  Practice safe sex. Use condoms. Only have 1 sex partner. PREVENTION  Basic prevention steps can help reduce the risk of upsetting the natural balance of bacteria in the vagina and developing BV:  Do not have sexual intercourse (be abstinent).  Do not douche.  Use all of the medicine prescribed for treatment of BV, even if the signs and symptoms go away.  Tell your sex partner if you have BV. That way, they can be treated, if needed, to prevent reoccurrence. SEEK MEDICAL CARE IF:     Your symptoms are not improving after 3 days of treatment.  You have increased discharge, pain, or fever. MAKE SURE YOU:   Understand these instructions.  Will watch your condition.  Will get help right away if you are not doing well or get worse. FOR MORE INFORMATION  Division of STD Prevention (DSTDP), Centers for Disease Control and Prevention:  SolutionApps.co.zawww.cdc.gov/std American Social Health Association (ASHA): www.ashastd.org  Document Released: 06/27/2005 Document Revised: 09/19/2011 Document Reviewed: 02/06/2013 [facility] Patient Information 2014 [place], [place]. Chest Wall Pain Chest wall pain is pain in or around the bones and muscles of your chest. It may take up to 6 weeks to get better. It may take longer if you must stay physically active in your work and activities.  CAUSES  Chest wall pain may happen on its own. However, it may be caused by:  A viral illness like the flu.  Injury.  Coughing.  Exercise.  Arthritis.  Fibromyalgia.  Shingles. HOME CARE INSTRUCTIONS   Avoid overtiring physical activity. Try not to strain or perform activities that cause pain. This includes any activities using your chest or your abdominal and side muscles, especially if heavy weights are used.  Put ice on the sore area.  Put ice in a plastic bag.  Place a towel between your skin and the bag.  Leave the ice on for 15-20 minutes per hour while awake for the first 2 days.  Only take over-the-counter or prescription medicines for pain, discomfort, or fever as directed by your caregiver. SEEK IMMEDIATE MEDICAL CARE IF:   Your pain increases, or you are very uncomfortable.  You have a fever.  Your chest pain becomes worse.  You have new, unexplained symptoms.  You have nausea or vomiting.  You feel sweaty or lightheaded.  You have a cough with phlegm (sputum), or you cough up blood. MAKE SURE YOU:   Understand these instructions.  Will watch your condition.  Will get help right away if you are not doing well or get worse. Document Released: 06/27/2005 Document Revised: 09/19/2011 Document Reviewed: 02/21/2011 [facility] Patient Information 2014 [place], [place].

## 2013-07-16 NOTE — ED Provider Notes (Signed)
CSN: [id number]     Arrival date & time 07/15/13  2054 History   First MD Initiated Contact with Patient 07/16/13 0202     Chief Complaint  Patient presents with  . Chest Pain   (Consider location/radiation/quality/duration/timing/severity/associated sxs/prior Treatment) HPI  This is a 38 year old female with a history of fibromyalgia who presents with chest tightness and shortness of breath. Reports onset of symptoms on Friday. Pain has been constant and nonradiating. She describes it as a tightness. She also reports shortness of breath. She reports nonproductive cough and nasal congestion. She reports the pain is worse with certain positions. She is taking Tylenol Sinus with only minimal relief.  Current pain is 8/10. She has not taken any anti-inflammatories. She denies any history of smoking or early family history of heart disease.  She denies any history of leg swelling, blood clots, estrogen use, recent hospitalizations, recent surgery or mobility.  Past Medical History  Diagnosis Date  . Vitamin B 12 deficiency   . Vitamin D deficiency   . Fibromyalgia    History reviewed. No pertinent past surgical history. Family History  Problem Relation Age of Onset  . Hypertension Mother   . Cancer Father   . Diabetes Other   . Hypertension Other    History  Substance Use Topics  . Smoking status: Never Smoker   . Smokeless tobacco: Never Used  . Alcohol Use: Yes   OB History   Grav Para Term Preterm Abortions TAB SAB Ect Mult Living   0              Review of Systems  Constitutional: Negative for fever.  Respiratory: Positive for cough, chest tightness and shortness of breath.   Cardiovascular: Negative for chest pain.  Gastrointestinal: Negative for nausea, vomiting and abdominal pain.  Genitourinary: Negative for dysuria.  Musculoskeletal: Negative for back pain.  Neurological: Negative for headaches.  All other systems reviewed and are negative.    Allergies   Vicodin  Home Medications   Current Outpatient Rx  Name  Route  Sig  Dispense  Refill  . Ascorbic Acid (VITAMIN C) 1000 MG tablet   Oral   Take 1,000 mg by mouth daily.         . cholecalciferol (VITAMIN D) 1000 UNITS tablet   Oral   Take 50,000 Units by mouth 2 (two) times a week. Tuesday and friday         . Cyanocobalamin (VITAMIN B-12 IJ)   Injection   Inject 1 Applicatorful as directed every 14 (fourteen) days.          Marland Kitchen guaiFENesin (MUCINEX) 600 MG 12 hr tablet   Oral   Take 1,200 mg by mouth daily.         . Iron-FA-B Cmp-C-Biot-Probiotic (FUSION PLUS PO)   Oral   Take 1 tablet by mouth daily.         . metroNIDAZOLE (FLAGYL) 500 MG tablet   Oral   Take 500 mg by mouth 3 (three) times daily. As needed         . Multiple Vitamins-Minerals (MULTIVITAMIN WITH MINERALS) tablet   Oral   Take 1 tablet by mouth daily.         . pseudoephedrine-acetaminophen (TYLENOL SINUS) 30-500 MG TABS   Oral   Take 2 tablets by mouth every 4 (four) hours as needed (sinus congestion).         Marland Kitchen ibuprofen (ADVIL,MOTRIN) 600 MG tablet   Oral   Take  1 tablet (600 mg total) by mouth every 6 (six) hours as needed.   30 tablet   0   . metroNIDAZOLE (FLAGYL) 500 MG tablet   Oral   Take 1 tablet (500 mg total) by mouth 2 (two) times daily.   14 tablet   0    BP 114/77  Pulse 78  Temp(Src) 98.7 F (37.1 C)  Resp 17  Ht 5\' 3"  (1.6 m)  Wt 159 lb (72.122 kg)  BMI 28.17 kg/m2  SpO2 99%  LMP 07/03/2013 Physical Exam  Nursing note and vitals reviewed. Constitutional: She is oriented to person, place, and time. She appears well-developed and well-nourished. No distress.  HENT:  Head: Normocephalic and atraumatic.  Eyes: Pupils are equal, round, and reactive to light.  Neck: Neck supple.  Cardiovascular: Normal rate, regular rhythm and normal heart sounds.   Pulmonary/Chest: Effort normal and breath sounds normal. No respiratory distress. She has no wheezes.  She exhibits tenderness.  Tenderness palpation of the anterior chest wall  Abdominal: Soft. Bowel sounds are normal. There is no tenderness. There is no rebound.  Neurological: She is alert and oriented to person, place, and time.  Skin: Skin is warm and dry.  Psychiatric: She has a normal mood and affect.   GU exam with scant vaginal discharge  ED Course  Procedures (including critical care time) Labs Review Labs Reviewed  WET PREP, GENITAL - Abnormal; Notable for the following:    Clue Cells Wet Prep HPF POC FEW (*)    WBC, Wet Prep HPF POC MODERATE (*)    All other components within normal limits  CBC - Abnormal; Notable for the following:    RDW 16.9 (*)    All other components within normal limits  URINALYSIS, ROUTINE W REFLEX MICROSCOPIC - Abnormal; Notable for the following:    Specific Gravity, Urine 1.035 (*)    Ketones, ur 15 (*)    Leukocytes, UA SMALL (*)    All other components within normal limits  URINE MICROSCOPIC-ADD ON - Abnormal; Notable for the following:    Squamous Epithelial / LPF MANY (*)    Bacteria, UA FEW (*)    All other components within normal limits  URINE CULTURE  BASIC METABOLIC PANEL  POCT PREGNANCY, URINE  POCT I-STAT TROPONIN I   Imaging Review Dg Chest 2 View  07/15/2013   CLINICAL DATA:  Chest pain and shortness of breath  EXAM: CHEST  2 VIEW  COMPARISON:  September 15, 2010  FINDINGS: Lungs are clear except for minimal atelectasis in the left base. Heart size and pulmonary vascularity are normal. No adenopathy. No pneumothorax. There is lower thoracic levoscoliosis.  IMPRESSION: Minimal left base atelectasis.  No edema or consolidation.   Electronically Signed   By: Bretta BangWilliam  Woodruff M.D.   On: 07/15/2013 21:49    EKG Interpretation    Date/Time:  Monday July 15 2013 20:58:40 EST Ventricular Rate:  85 PR Interval:  154 QRS Duration: 84 QT Interval:  382 QTC Calculation: 454 R Axis:   88 Text Interpretation:  Normal sinus rhythm Normal  ECG Confirmed by Tara Rud  MD, Shilynn Hoch ([id number]) on 07/16/2013 2:34:02 AM            MDM   1. Chest wall pain   2. Bacterial vaginosis    The patient presents with 2 to three-day history of chest pain and congestion. She is nontoxic on exam. EKG is nonischemic. She has reproducible chest pain on exam. She has not  taken any anti-inflammatories at home. I suspect her chest pain is likely musculoskeletal secondary to an acute URI. Patient was given Toradol.  After my initial violation, patient subsequently endorsed to the nurse vaginal discharge. She denies any abdominal pain. She does not wish to be evaluated or treated for STDs but does think she has a yeast infection. Wet prep is notable for multiple clue cells. Patient will be discharged home with metronidazole. She was encouraged to continue ibuprofen for chest wall pain.  After history, exam, and medical workup I feel the patient has been appropriately medically screened and is safe for discharge home. Pertinent diagnoses were discussed with the patient. Patient was given return precautions.    Shon Baton, MD 07/16/13 [phone]

## 2013-07-17 LAB — URINE CULTURE
COLONY COUNT: NO GROWTH
Culture: NO GROWTH

## 2013-07-19 ENCOUNTER — Telehealth (HOSPITAL_COMMUNITY): Payer: Self-pay

## 2014-02-11 IMAGING — CR DG CHEST 2V
2 series · 2 of 2 positions shown · non-contrast
Comparison: September 15, 2010

CLINICAL DATA: Chest pain and shortness of breath

EXAM:
CHEST  2 VIEW

[w chest pa]
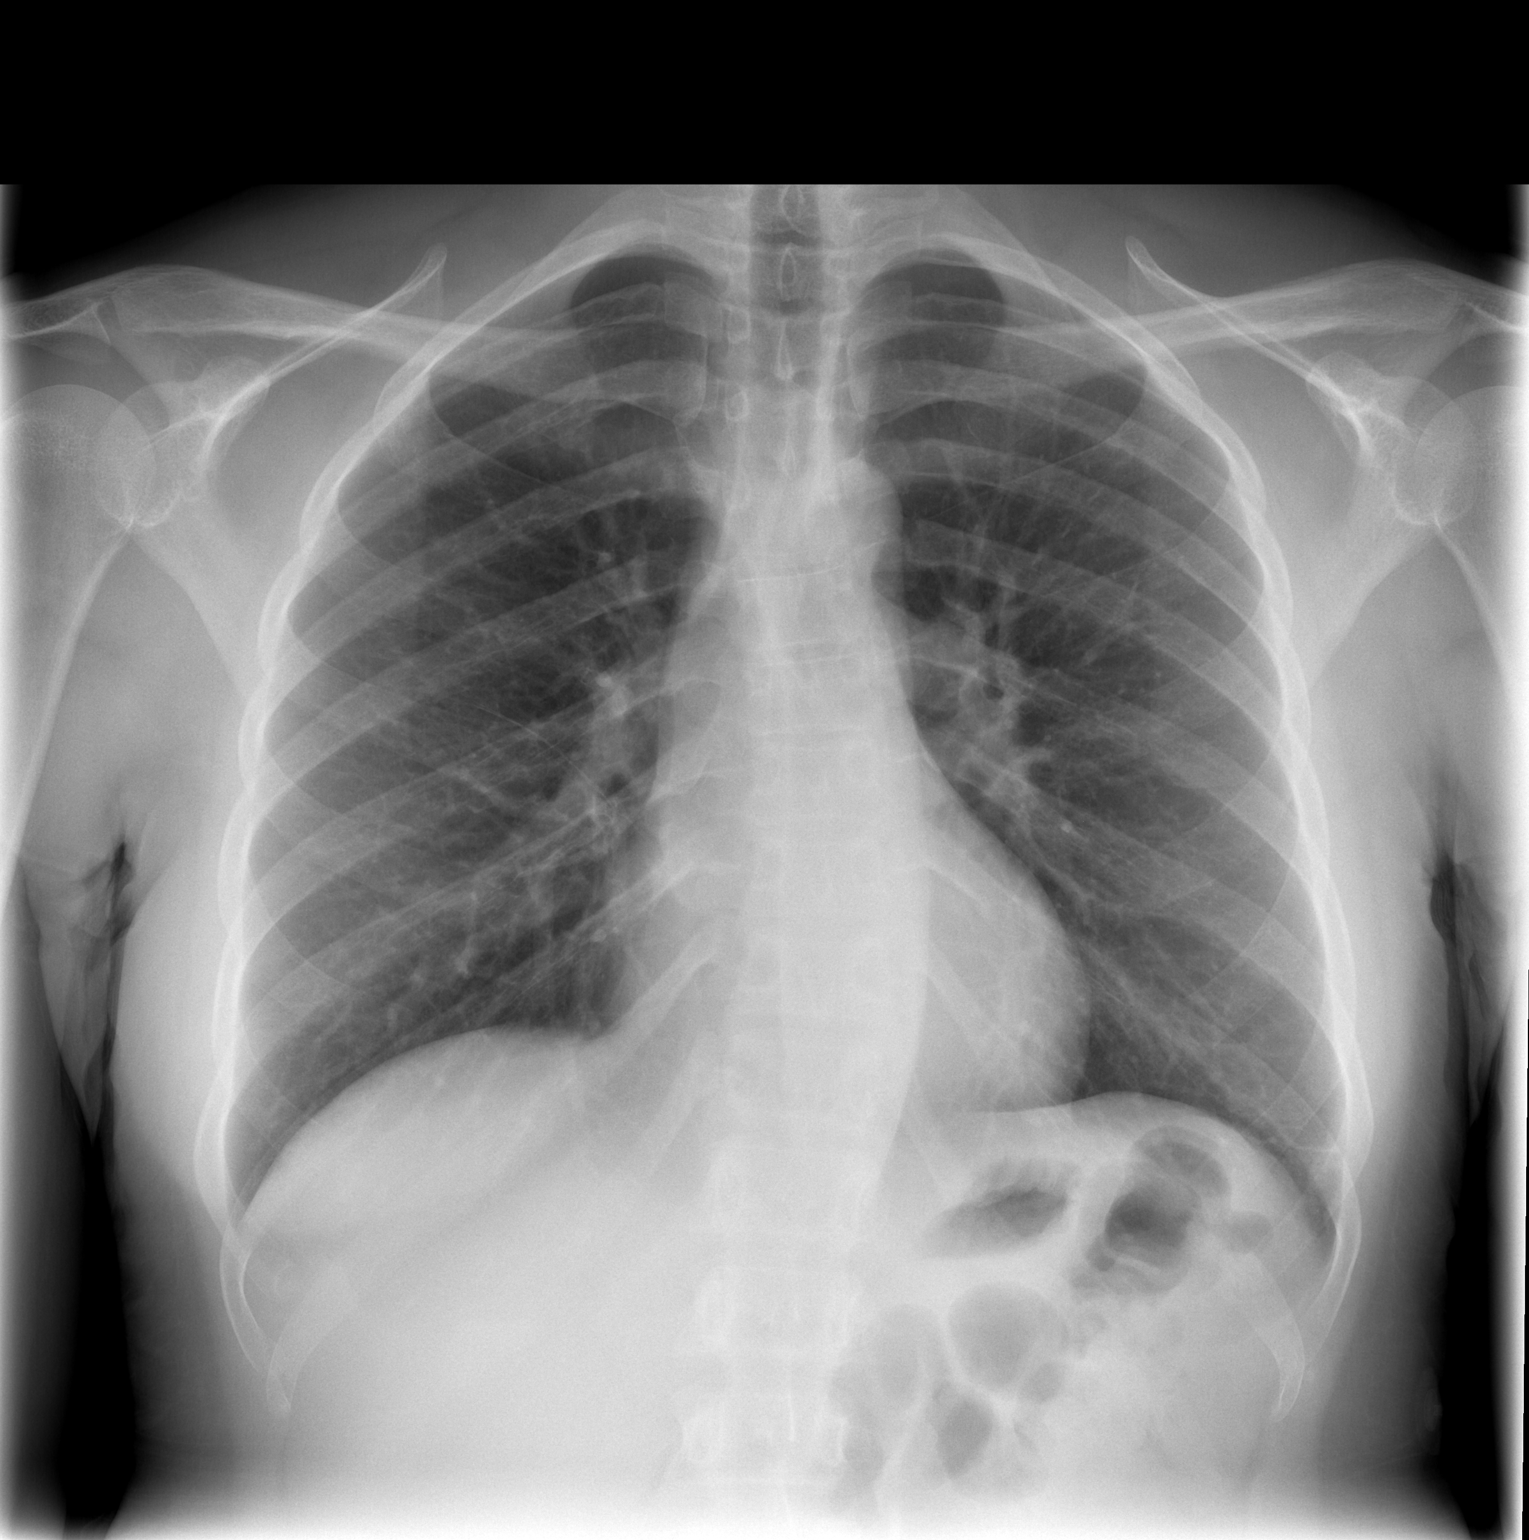

[w chest lat]
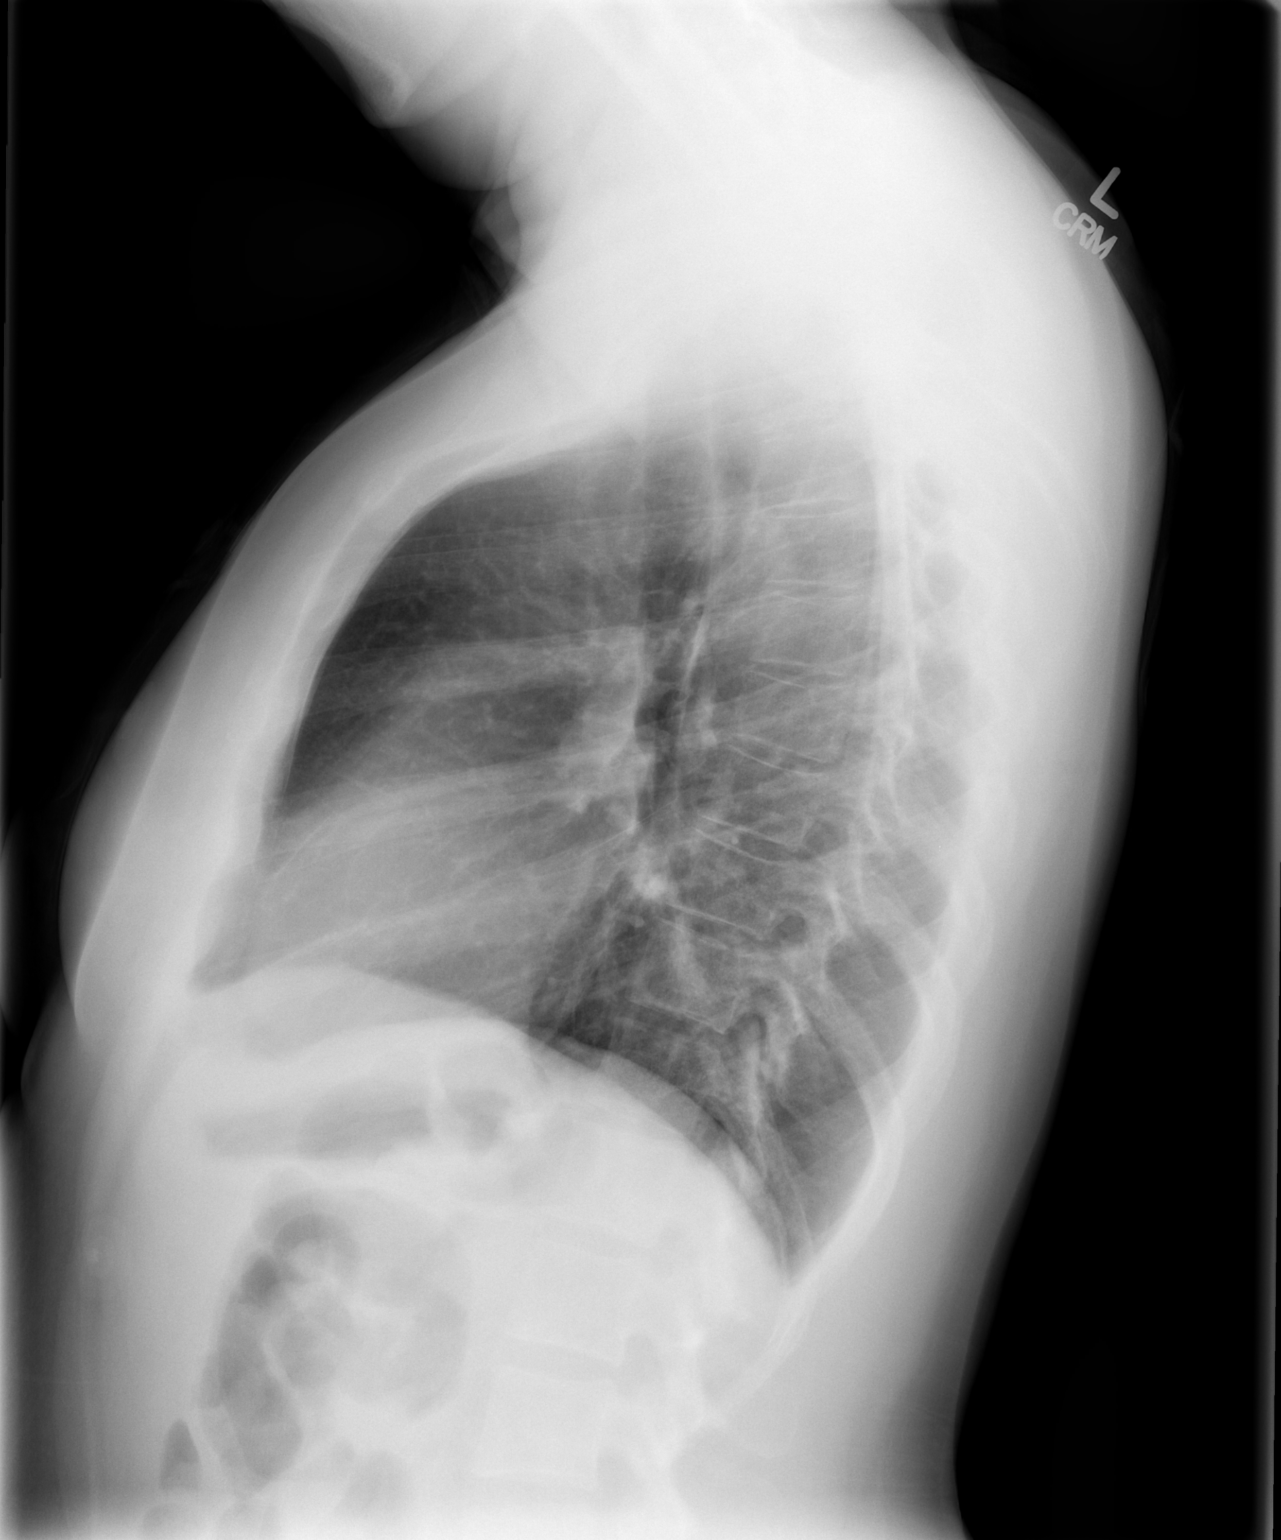

[2 of 2 positions shown; findings below may reference images not displayed]

FINDINGS: Lungs are clear except for minimal atelectasis in the left base.
Heart size and pulmonary vascularity are normal. No adenopathy. No
pneumothorax. There is lower thoracic levoscoliosis.
IMPRESSION: Minimal left base atelectasis.  No edema or consolidation.

## 2015-11-20 ENCOUNTER — Ambulatory Visit (INDEPENDENT_AMBULATORY_CARE_PROVIDER_SITE_OTHER): Payer: Self-pay | Admitting: Physician Assistant

## 2015-11-20 VITALS — BP 116/74 | HR 83 | Temp 98.0°F | Resp 18 | Ht 63.25 in | Wt 154.2 lb

## 2015-11-20 DIAGNOSIS — R21 Rash and other nonspecific skin eruption: Secondary | ICD-10-CM

## 2015-11-20 MED ORDER — TRIAMCINOLONE ACETONIDE 0.1 % EX CREA: 1.0000 "application " | TOPICAL_CREAM | Freq: Two times a day (BID) | CUTANEOUS | Status: AC

## 2015-11-20 NOTE — Patient Instructions (Addendum)
Apply steroid cream twice a day for 1-2 weeks - can stop when rash goes away. Should not need to continue cream beyond 2 weeks. If not getting better in 1 week or if symptoms worsen at any time, return to be seen If you develop fever, joint pain, nausea, vomiting, -- return    IF you received an x-ray today, you will receive an invoice from [facility]. Please contact [facility] at [phone] with questions or concerns regarding your invoice.   IF you received labwork today, you will receive an invoice from [facility]. Please contact [facility] at [phone] with questions or concerns regarding your invoice.   Our billing staff will not be able to assist you with questions regarding bills from these companies.  You will be contacted with the lab results as soon as they are available. The fastest way to get your results is to activate your My Chart account. Instructions are located on the last page of this paperwork. If you have not heard from us regarding the results in 2 weeks, please contact this office.

## 2015-11-20 NOTE — Progress Notes (Signed)
[facility] [address] [place] [address] [phone]  Date:  11/20/2015   Name:  Samantha Velazquez   DOB:  1976/04/21   MRN:  [id number]  PCP:  Hal Morales, MD    Chief Complaint: Rash and Other   History of Present Illness:  This is a 40 y.o. female who is presenting with a rash on her hands and feet x 4 days. Feels it is getting worse. Rash is pruritic. She is otherwise feeling ok - no fatigue, fever, chills. No lesions in her mouth or anywhere else on her body. No insect bites. Hasn't been outside for extended periods of time recently. Not currently sexually active -- last sexually active 7 months ago. Last had STD testing 1 year ago and al negative. She does not have any known allergies other than vicodin. She is here for work -- she lives in [place]. She is attending a graduation party for a family member in a few days, she is worried about going to the party with this rash. Has not tried anything for her symptoms so far. No exposure to children.  Review of Systems:  Review of Systems See HPI  There are no active problems to display for this patient.   Prior to Admission medications   Medication Sig Start Date End Date Taking? Authorizing Provider  Ascorbic Acid (VITAMIN C) 1000 MG tablet Take 1,000 mg by mouth daily.   Yes Historical Provider, MD  cholecalciferol (VITAMIN D) 1000 UNITS tablet Take 50,000 Units by mouth 2 (two) times a week. Tuesday and friday   Yes Historical Provider, MD  Cyanocobalamin (VITAMIN B-12 IJ) Inject 1 Applicatorful as directed every 14 (fourteen) days.    Yes Historical Provider, MD  Iron-FA-B Cmp-C-Biot-Probiotic (FUSION PLUS PO) Take 1 tablet by mouth daily.   Yes Historical Provider, MD  Multiple Vitamins-Minerals (MULTIVITAMIN WITH MINERALS) tablet Take 1 tablet by mouth daily.   Yes Historical Provider, MD    Allergies  Allergen Reactions  . Vicodin [Hydrocodone-Acetaminophen] Anxiety and Palpitations     History reviewed. No pertinent past surgical history.  Social History  Substance Use Topics  . Smoking status: Never Smoker   . Smokeless tobacco: Never Used  . Alcohol Use: Yes    Family History  Problem Relation Age of Onset  . Hypertension Mother   . Cancer Father   . Diabetes Other   . Hypertension Other     Medication list has been reviewed and updated.  Physical Examination:  Physical Exam  Constitutional: She is oriented to person, place, and time. She appears well-developed and well-nourished. No distress.  HENT:  Head: Normocephalic and atraumatic.  Right Ear: Hearing normal.  Left Ear: Hearing normal.  Nose: Nose normal.  Mouth/Throat: Uvula is midline, oropharynx is clear and moist and mucous membranes are normal. No oral lesions.  Eyes: Conjunctivae and lids are normal. Right eye exhibits no discharge. Left eye exhibits no discharge. No scleral icterus.  Cardiovascular: Normal rate, regular rhythm, normal heart sounds and normal pulses.   No murmur heard. Pulmonary/Chest: Effort normal and breath sounds normal. No respiratory distress. She has no wheezes. She has no rhonchi. She has no rales.  Musculoskeletal: Normal range of motion.  Lymphadenopathy:       Head (right side): No submental, no submandibular and no tonsillar adenopathy present.       Head (left side): No submental, no submandibular and no tonsillar adenopathy present.    She has no  cervical adenopathy.  Neurological: She is alert and oriented to person, place, and time.  Skin: Skin is warm, dry and intact.  Purple maculopapular lesions on bilateral palms. No tenderness. Medial arches of bilateral feet with lichenified purple discolored skin. Onto dorsal feet there are purple maculopapular lesions. No tenderness.  Psychiatric: She has a normal mood and affect. Her speech is normal and behavior is normal. Thought content normal.   BP 116/74 mmHg  Pulse 83  Temp(Src) 98 F (36.7 C) (Oral)   Resp 18  Ht 5' 3.25" (1.607 m)  Wt 154 lb 3.2 oz (69.945 kg)  BMI 27.08 kg/m2  SpO2 100%  LMP 11/17/2015  Assessment and Plan:  1. Rash Most likely contact dermatitis, unknown allergen. Less likely HFMD, syphilis, lyme/RMSF. Wanted to do blood work to rule out the less likely causes however pt is self-pay and declined testing. Treat with kenalog BID x 1-2 weeks. Follow up if symptoms not improving in 1 week. Follow up if develop fever, arthralgias, n/v. - triamcinolone cream (KENALOG) 0.1 %; Apply 1 application topically 2 (two) times daily.  Dispense: 30 g; Refill: 0   Roswell MinersNicole V. Dyke BrackettBush, PA-C, MHS [facility]  11/26/2015
